# Patient Record
Sex: Female | Born: 2000 | Race: White | Hispanic: No | Marital: Single | Smoking: Never smoker
Health system: Southern US, Community
[De-identification: ages and names within clinical notes are randomized; demographics above are authoritative.]

## PROBLEM LIST (undated history)

## (undated) DIAGNOSIS — D649 Anemia, unspecified: Secondary | ICD-10-CM

## (undated) DIAGNOSIS — E739 Lactose intolerance, unspecified: Secondary | ICD-10-CM

## (undated) DIAGNOSIS — K219 Gastro-esophageal reflux disease without esophagitis: Secondary | ICD-10-CM

## (undated) DIAGNOSIS — G43909 Migraine, unspecified, not intractable, without status migrainosus: Secondary | ICD-10-CM

## (undated) DIAGNOSIS — F32A Depression, unspecified: Secondary | ICD-10-CM

## (undated) DIAGNOSIS — N946 Dysmenorrhea, unspecified: Secondary | ICD-10-CM

## (undated) DIAGNOSIS — F419 Anxiety disorder, unspecified: Secondary | ICD-10-CM

## (undated) DIAGNOSIS — K5900 Constipation, unspecified: Secondary | ICD-10-CM

## (undated) DIAGNOSIS — T7840XA Allergy, unspecified, initial encounter: Secondary | ICD-10-CM

## (undated) HISTORY — DX: Lactose intolerance, unspecified: E73.9

## (undated) HISTORY — DX: Anemia, unspecified: D64.9

## (undated) HISTORY — DX: Allergy, unspecified, initial encounter: T78.40XA

## (undated) HISTORY — DX: Dysmenorrhea, unspecified: N94.6

## (undated) HISTORY — DX: Constipation, unspecified: K59.00

## (undated) HISTORY — DX: Anxiety disorder, unspecified: F41.9

## (undated) HISTORY — DX: Depression, unspecified: F32.A

## (undated) HISTORY — DX: Migraine, unspecified, not intractable, without status migrainosus: G43.909

## (undated) HISTORY — PX: TYMPANOSTOMY TUBE PLACEMENT: SHX32

## (undated) HISTORY — DX: Gastro-esophageal reflux disease without esophagitis: K21.9

---
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
## 2019-03-05 IMAGING — MR MR HEAD W/O CM
7 of 9 series · 32 of 48 positions shown · non-contrast
Comparison: [HOSPITAL] noncontrast paranasal sinus CT
08/12/2005.

CLINICAL DATA: 16-year-old female with episodes over the past year
of sudden onset sharp right superior head pain/headache lasting up
to 30 sec each time. No known injury.

EXAM:
MRI HEAD WITHOUT CONTRAST
TECHNIQUE: Multiplanar, multiecho pulse sequences of the brain and surrounding
structures were obtained without intravenous contrast.

[Series 2: T1 · sagittal · 5.0mm · 0.45mm/px · 4 of 25 slices shown (1 of 2)]
[im 1/25]
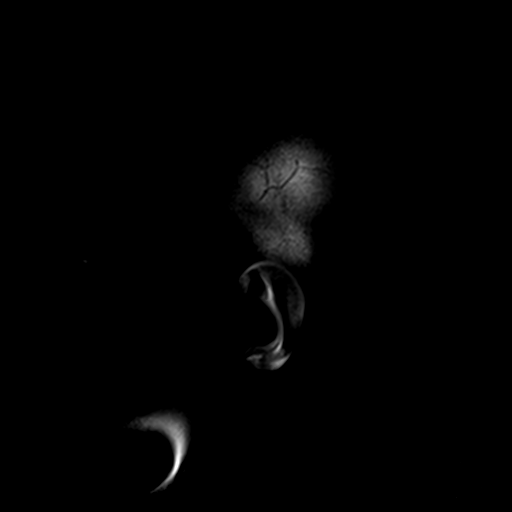
[im 9/25]
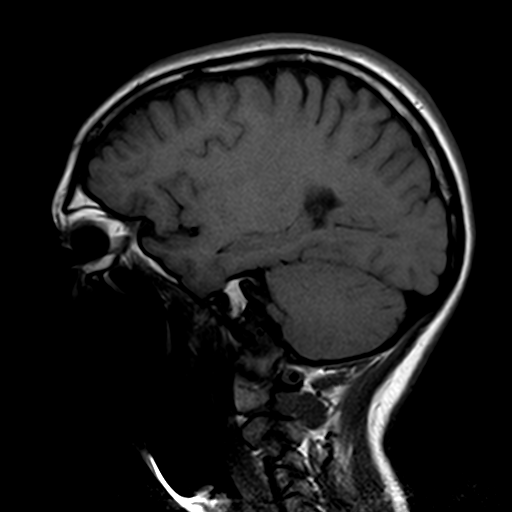
[im 17/25]
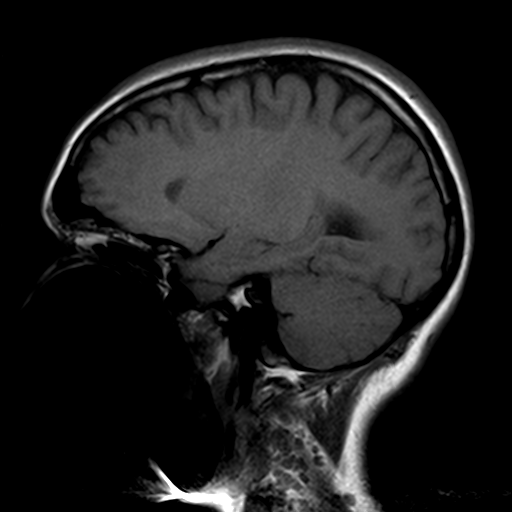
[im 25/25]
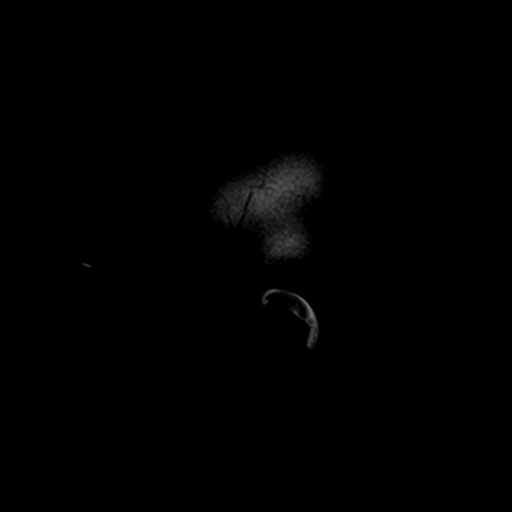

[Series 3: DWI · axial · 5.0mm · 1.80mm/px · z∈[-22,+119]mm · 7 of 43 slices shown (1 of 2)]
[im 1/43]
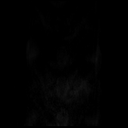
[im 8/43]
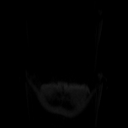
[im 15/43]
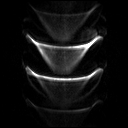
[im 22/43]
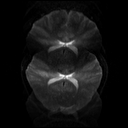
[im 29/43]
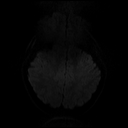
[im 36/43]
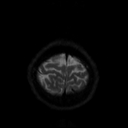
[im 43/43]
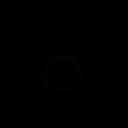

[Series 4: DWI · axial · 5.0mm · 1.80mm/px · z∈[-22,+119]mm · 4 of 24 slices shown (2 of 2)]
[im 1/24]
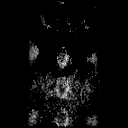
[im 8/24]
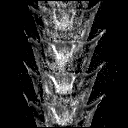
[im 16/24]
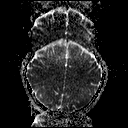
[im 24/24]
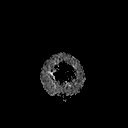

[Series 5: T2 · axial · 5.0mm · 0.51mm/px · z∈[-22,+119]mm · 4 of 24 slices shown (1 of 2)]
[im 1/24]
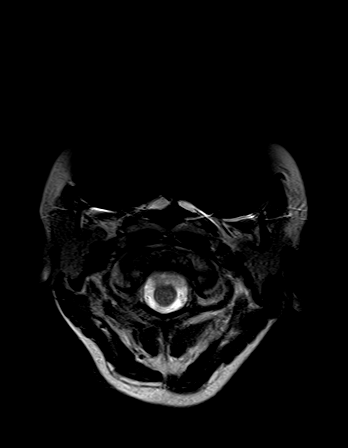
[im 8/24]
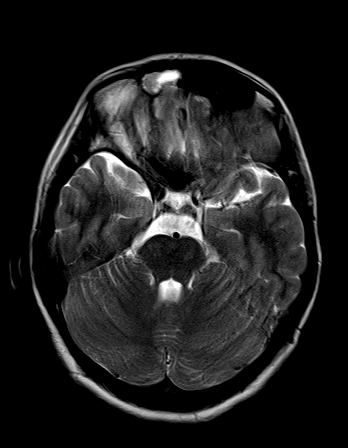
[im 16/24]
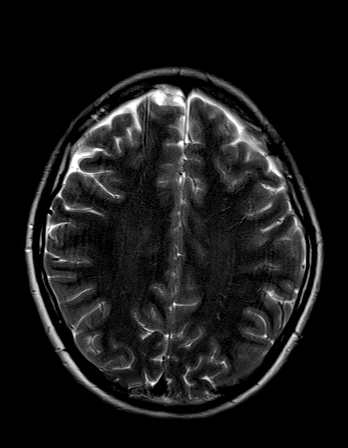
[im 24/24]
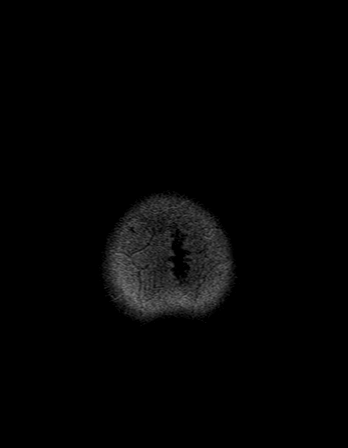

[Series 7: FLAIR · axial · 5.0mm · 0.45mm/px · z∈[-22,+119]mm · 4 of 24 slices shown]
[im 1/24]
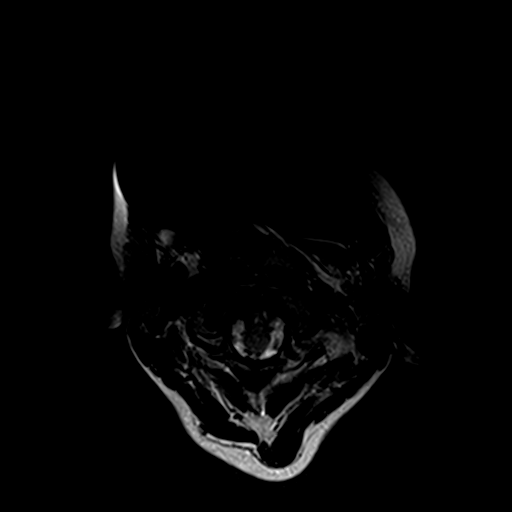
[im 8/24]
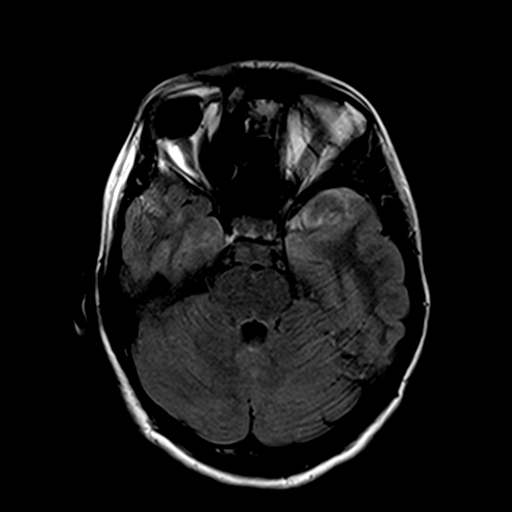
[im 16/24]
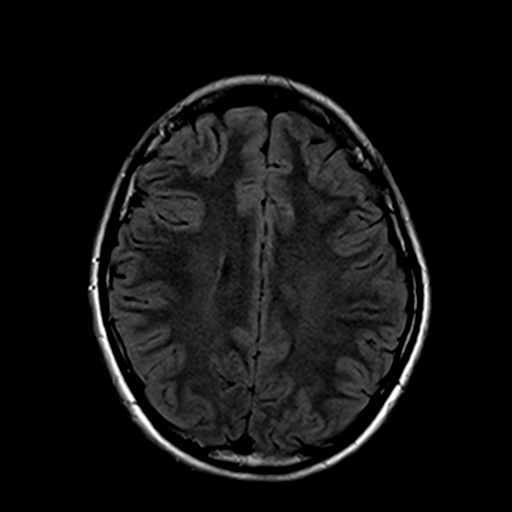
[im 24/24]
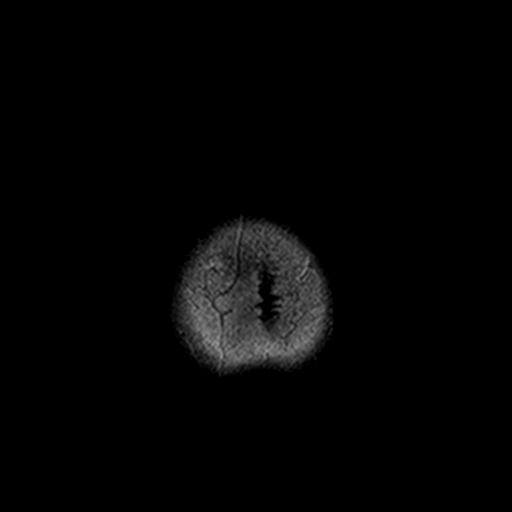

[Series 9: T2 · coronal · 5.0mm · 0.45mm/px · 5 of 30 slices shown (2 of 2)]
[im 1/30]
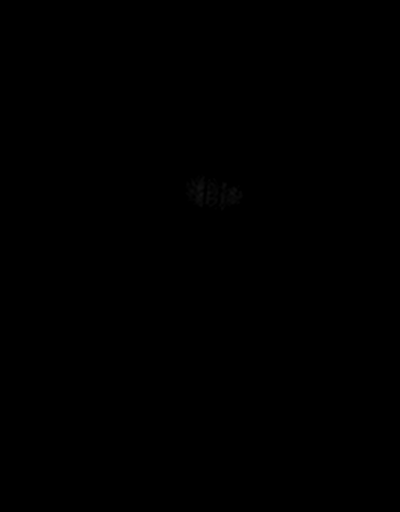
[im 8/30]
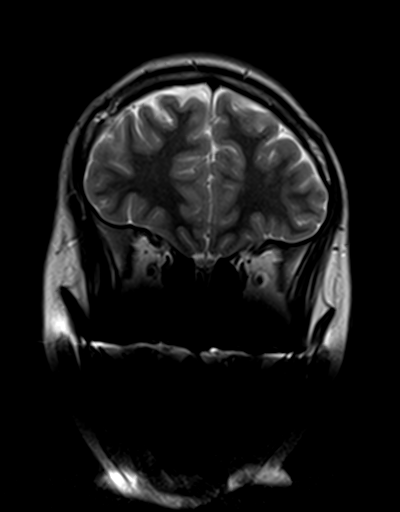
[im 15/30]
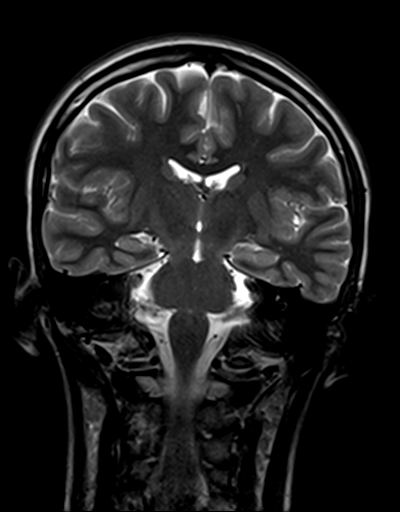
[im 22/30]
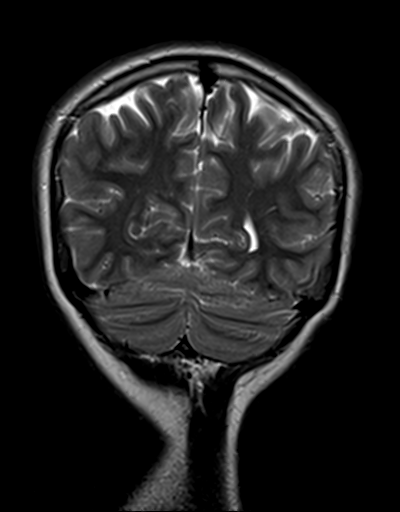
[im 30/30]
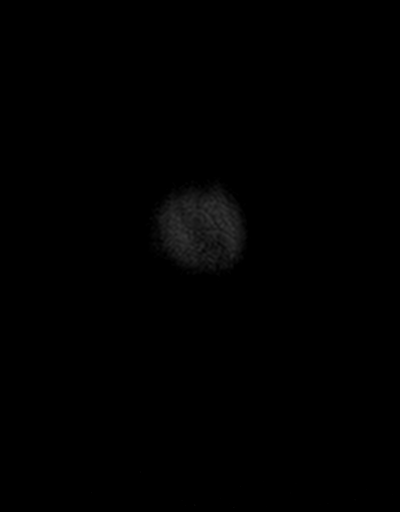

[Series 10: T1 · axial · 5.0mm · 0.45mm/px · z∈[-19,+116]mm · 4 of 23 slices shown (2 of 2)]
[im 1/23]
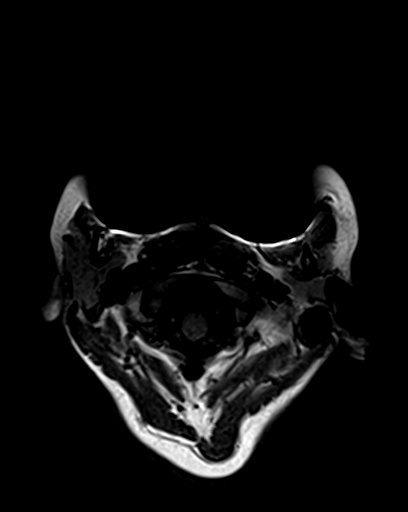
[im 8/23]
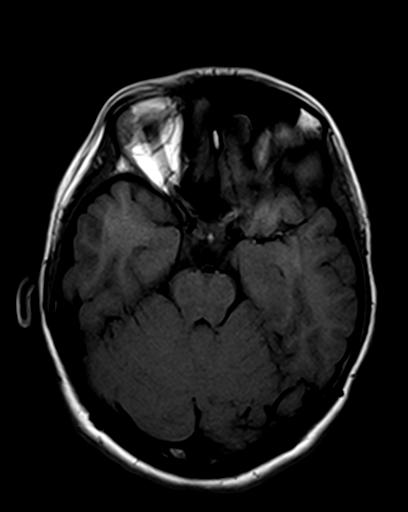
[im 15/23]
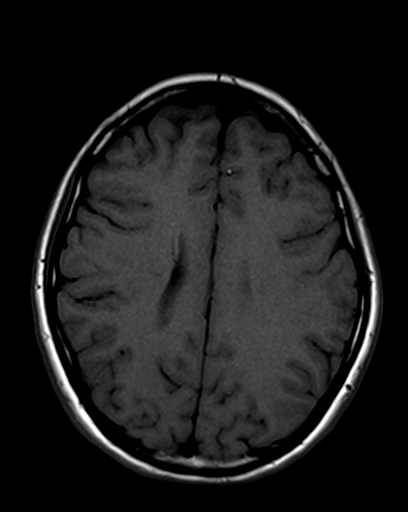
[im 23/23]
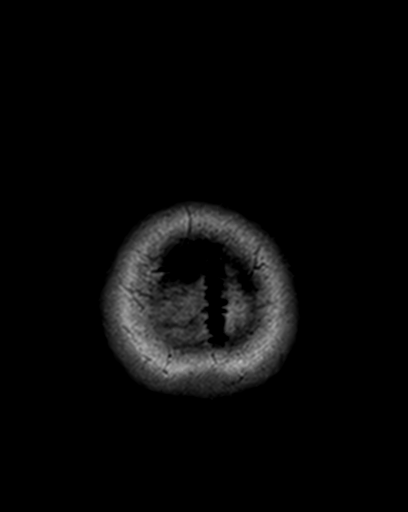

[32 of 48 positions shown; findings below may reference images not displayed]

FINDINGS: Metallic susceptibility distortion related to dental braces limits
some portions of the study despite alterations to the imaging
technique to minimize artifacts, especially diffusion weighted and
T2 weighted imaging.

Brain: Cerebral volume is within normal limits. Midline structures
are normally formed.

No restricted diffusion is evident. No midline shift, mass effect,
evidence of mass lesion, ventriculomegaly, extra-axial collection or
acute intracranial hemorrhage. Cervicomedullary junction and
pituitary are within normal limits.

Allowing for susceptibility artifact, gray and white matter signal
is within normal limits throughout the brain. No encephalomalacia
identified.

Vascular: Major intracranial vascular flow voids are preserved.

Skull and upper cervical spine: Negative visible cervical spine.
Visualized bone marrow signal is within normal limits.

Sinuses/Orbits: Pansinus paranasal sinus opacification demonstrated
in 8662. Visible sinus aeration has improved since that time,
although there remains at least moderate opacification of the right
frontal, left anterior ethmoids and right posterior ethmoid air
cells. The maxillary sinuses are poorly visible.

Grossly normal orbits soft tissues.

Other: Bilateral mastoid air cells remain clear. Visible internal
auditory structures appear normal. Visible scalp and neck soft
tissues appear normal.
IMPRESSION: 1. No brain abnormality identified when allowing for dental braces
related susceptibility artifact.
2. Moderate scattered paranasal sinus disease, although improved
compared to a 8662 sinus CT where pansinusitis was demonstrated.

## 2019-05-26 ENCOUNTER — Other Ambulatory Visit: Payer: Self-pay | Admitting: *Deleted

## 2019-05-26 MED ORDER — AMITRIPTYLINE HCL 25 MG PO TABS: 25.0000 mg | ORAL_TABLET | Freq: Every day | ORAL | 0 refills | Status: DC

## 2019-07-07 ENCOUNTER — Telehealth: Payer: Self-pay | Admitting: *Deleted

## 2019-07-07 NOTE — Telephone Encounter (Signed)
Pt calling c/o spotting to light flow since put new Nuvaring in on the 20 th. Pt said she went to college on the 17 th and was suppose to put new ring in on the 18 th but forgot it at home and her mom over nighted it to her. Pt said she put the new Nuvaring in on the 20 th and has been having spotting to light flow since. Pt said this has happened before. Told pt she probably just very sensitive and since she was late putting new ring in it is probably why she is spotting. Asked pt if she has any cramps? Pt said no. Told pt is should stop in a few days. Keep Nuvaring in and monitor if continues or develops increase in bleeding or cramps please let us know. Pt verbalized understanding.

## 2019-07-27 DIAGNOSIS — Z20822 Contact with and (suspected) exposure to covid-19: Secondary | ICD-10-CM | POA: Diagnosis not present

## 2019-08-16 ENCOUNTER — Other Ambulatory Visit: Payer: Self-pay | Admitting: *Deleted

## 2019-08-16 MED ORDER — ETONOGESTREL-ETHINYL ESTRADIOL 0.12-0.015 MG/24HR VA RING: VAGINAL_RING | VAGINAL | 2 refills | Status: DC

## 2019-08-20 ENCOUNTER — Other Ambulatory Visit: Payer: Self-pay | Admitting: Physician Assistant

## 2019-08-20 NOTE — Telephone Encounter (Signed)
MEDICATION: Nuvaring 0.12-0.015MG , amitriptyline 25 MG  PHARMACY: [facility], [place] [address]   Comments:   **Let patient know to contact pharmacy at the end of the day to make sure medication is ready. **  ** Please notify patient to allow 48-72 hours to process**  **Encourage patient to contact the pharmacy for refills or they can request refills through [facility]**

## 2019-08-30 DIAGNOSIS — Z03818 Encounter for observation for suspected exposure to other biological agents ruled out: Secondary | ICD-10-CM | POA: Diagnosis not present

## 2019-09-01 DIAGNOSIS — Z20828 Contact with and (suspected) exposure to other viral communicable diseases: Secondary | ICD-10-CM | POA: Diagnosis not present

## 2019-11-03 ENCOUNTER — Other Ambulatory Visit: Payer: Self-pay | Admitting: Physician Assistant

## 2019-12-20 ENCOUNTER — Other Ambulatory Visit: Payer: Self-pay | Admitting: Physician Assistant

## 2019-12-30 ENCOUNTER — Ambulatory Visit: Payer: BC Managed Care – PPO | Attending: Internal Medicine

## 2019-12-30 DIAGNOSIS — Z23 Encounter for immunization: Secondary | ICD-10-CM

## 2019-12-30 NOTE — Progress Notes (Signed)
   Covid-19 Vaccination Clinic  Name:  Ellyana Crigler    MRN: [id number] DOB: February 22, 2001  12/30/2019  Ms. Naser was observed post Covid-19 immunization for 15 minutes without incident. She was provided with Vaccine Information Sheet and instruction to access the V-Safe system.   Ms. Dimascio was instructed to call 911 with any severe reactions post vaccine: Marland Kitchen Difficulty breathing  . Swelling of face and throat  . A fast heartbeat  . A bad rash all over body  . Dizziness and weakness   Immunizations Administered    Name Date Dose VIS Date Route   Pfizer COVID-19 Vaccine 12/30/2019  8:20 AM 0.3 mL 08/04/2018 Intramuscular   Manufacturer: [employer]   Lot: [id number]   [place]: [id number]

## 2020-01-05 ENCOUNTER — Other Ambulatory Visit: Payer: Self-pay | Admitting: Physician Assistant

## 2020-01-05 NOTE — Telephone Encounter (Signed)
  LAST APPOINTMENT DATE: 12/20/2019   NEXT APPOINTMENT DATE:@Visit  date not found  MEDICATION: amitriptyline (ELAVIL) 25 MG tablet  PHARMACY: [id number] [place], [place] - [address]  COMMENTS: Patient states she is out and the pharmacy is needing approval from Sam.

## 2020-01-19 ENCOUNTER — Other Ambulatory Visit: Payer: Self-pay | Admitting: Physician Assistant

## 2020-04-10 ENCOUNTER — Other Ambulatory Visit: Payer: Self-pay | Admitting: Physician Assistant

## 2020-04-12 ENCOUNTER — Telehealth: Payer: Self-pay

## 2020-04-12 MED ORDER — AMITRIPTYLINE HCL 25 MG PO TABS: ORAL_TABLET | ORAL | 2 refills | Status: DC

## 2020-04-12 NOTE — Telephone Encounter (Signed)
..   LAST APPOINTMENT DATE: 04/10/2020   NEXT APPOINTMENT DATE:@Visit  date not found  MEDICATION:amitriptyline (ELAVIL) 25 MG tablet    [id number] - [place], [place]     Pt states that the pharmacy told her they lost her prescription and need a new one

## 2020-04-12 NOTE — Telephone Encounter (Signed)
Medication resent

## 2020-05-23 ENCOUNTER — Other Ambulatory Visit: Payer: Self-pay | Admitting: Physician Assistant

## 2020-05-31 ENCOUNTER — Other Ambulatory Visit: Payer: Self-pay | Admitting: Physician Assistant

## 2020-06-17 DIAGNOSIS — M7581 Other shoulder lesions, right shoulder: Secondary | ICD-10-CM | POA: Diagnosis not present

## 2020-06-17 DIAGNOSIS — M25511 Pain in right shoulder: Secondary | ICD-10-CM | POA: Diagnosis not present

## 2020-06-27 DIAGNOSIS — Z20822 Contact with and (suspected) exposure to covid-19: Secondary | ICD-10-CM | POA: Diagnosis not present

## 2020-06-27 DIAGNOSIS — J029 Acute pharyngitis, unspecified: Secondary | ICD-10-CM | POA: Diagnosis not present

## 2020-06-27 DIAGNOSIS — U071 COVID-19: Secondary | ICD-10-CM | POA: Diagnosis not present

## 2020-08-01 ENCOUNTER — Other Ambulatory Visit: Payer: Self-pay | Admitting: Physician Assistant

## 2020-08-28 ENCOUNTER — Other Ambulatory Visit: Payer: Self-pay | Admitting: Physician Assistant

## 2020-09-28 ENCOUNTER — Telehealth: Payer: Self-pay

## 2020-09-28 NOTE — Telephone Encounter (Signed)
MEDICATION: etonogestrel-ethinyl estradiol (NUVARING) 0.12-0.015 MG/24HR vaginal ring   amitriptyline (ELAVIL) 25 MG tablet  PHARMACY:  [id number] - [place], [place] Phone:  [phone]  Fax:  [phone]       Comments:   **Let patient know to contact pharmacy at the end of the day to make sure medication is ready. **  ** Please notify patient to allow 48-72 hours to process**  **Encourage patient to contact the pharmacy for refills or they can request refills through [facility]**

## 2020-09-28 NOTE — Telephone Encounter (Signed)
LVM for patient call back and schedule appt.

## 2020-09-28 NOTE — Telephone Encounter (Signed)
Please call pt and schedule an appointment we have not seen patient in 2 years. I can not fill medications until pt is seen. Sorry

## 2020-10-02 ENCOUNTER — Telehealth: Payer: BC Managed Care – PPO | Admitting: Physician Assistant

## 2020-10-03 ENCOUNTER — Encounter: Payer: Self-pay | Admitting: Physician Assistant

## 2020-10-03 ENCOUNTER — Telehealth (INDEPENDENT_AMBULATORY_CARE_PROVIDER_SITE_OTHER): Payer: BC Managed Care – PPO | Admitting: Physician Assistant

## 2020-10-03 VITALS — Ht 67.0 in | Wt 157.0 lb

## 2020-10-03 DIAGNOSIS — R6889 Other general symptoms and signs: Secondary | ICD-10-CM | POA: Diagnosis not present

## 2020-10-03 DIAGNOSIS — Z3009 Encounter for other general counseling and advice on contraception: Secondary | ICD-10-CM | POA: Diagnosis not present

## 2020-10-03 DIAGNOSIS — G43009 Migraine without aura, not intractable, without status migrainosus: Secondary | ICD-10-CM

## 2020-10-03 MED ORDER — AMITRIPTYLINE HCL 25 MG PO TABS: 25.0000 mg | ORAL_TABLET | Freq: Every day | ORAL | 3 refills | Status: DC

## 2020-10-03 MED ORDER — ETONOGESTREL-ETHINYL ESTRADIOL 0.12-0.015 MG/24HR VA RING: VAGINAL_RING | VAGINAL | 3 refills | Status: DC

## 2020-10-03 NOTE — Progress Notes (Signed)
Virtual Visit via Video   I connected with Courtney Shelton on 10/03/20 at  7:30 AM EDT by a video enabled telemedicine application and verified that I am speaking with the correct person using two identifiers. Location patient: Home Location provider: [facility], Office Persons participating in the virtual visit: Laylia Arlis, [place] PA-C, Anselmo Pickler, LPN   I discussed the limitations of evaluation and management by telemedicine and the availability of in person appointments. The patient expressed understanding and agreed to proceed.  I acted as a Education administrator for [employer], PA-C [employer], LPN   Subjective:   HPI:   Courtney Shelton is in her 3rd year at [employer].  Migraine Pt has been having headaches off and on. Currently taking Amitriptyline 25 mg at bedtime. Needs refill. This medication is working well for her. Denies auras, changes in migraines, n/v, changes in vision.  Birth control Courtney Shelton is currently using Nuva-Ring. Feels this is working well for her and making her periods more manageable in regards to flow and pain. Courtney Shelton would like to continue use of this. Denies concerns with blood clots, leg pain, chest pain, SOB, moodiness.  Temperature Intolerance Feels like over the past few weeks Courtney Shelton has been more hot than usual. Does have history of ?goiter and ?thyroiditis per her chart. Most recent thyroid labs WNL. Courtney Shelton denies unintentional weight changes or changes to skin or hair.  ROS: See pertinent positives and negatives per HPI.  Patient Active Problem List   Diagnosis Date Noted  . 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 Seizure-like activity ([place]) 04/30/2017  . Thyroiditis, autoimmune 01/18/2013  . 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 Goiter 09/07/2012  . Skin striae 09/07/2012  . Dyspepsia 09/07/2012  . GERD (gastroesophageal reflux disease) 09/07/2012  . Dysmenorrhea in the adolescent   . Lactose intolerance   . Constipation     Social History   Tobacco  Use  . Smoking status: Never Smoker  . Smokeless tobacco: Never Used  Substance Use Topics  . Alcohol use: No    Alcohol/week: 0.0 standard drinks    Current Outpatient Medications:  .  B Complex Vitamins (B COMPLEX PO), Take 1 capsule by mouth daily in the afternoon., Disp: , Rfl:  .  esomeprazole (NEXIUM) 40 MG packet, Take 40 mg by mouth daily before breakfast., Disp: 30 each, Rfl: 2 .  fexofenadine (ALLEGRA) 180 MG tablet, Take 180 mg by mouth daily., Disp: , Rfl:  .  fluticasone (FLONASE) 50 MCG/ACT nasal spray, Place 2 sprays into both nostrils daily., Disp: 16 g, Rfl: 6 .  vitamin C (ASCORBIC ACID) 500 MG tablet, Take 500 mg by mouth daily., Disp: , Rfl:  .  Zinc Sulfate (ZINC-220 PO), Take 1 tablet by mouth daily in the afternoon., Disp: , Rfl:  .  amitriptyline (ELAVIL) 25 MG tablet, Take 1 tablet (25 mg total) by mouth at bedtime., Disp: 90 tablet, Rfl: 3 .  etonogestrel-ethinyl estradiol (NUVARING) 0.12-0.015 MG/24HR vaginal ring, INSERT 1 RING VAGINALLY AS DIRECTED. REMOVE AFTER 3 WEEKS. WAIT 7 DAYS BEFORE INSERTING NEW RING, Disp: 3 each, Rfl: 3  Allergies  Allergen Reactions  . Zithromax [Azithromycin Dihydrate]   . Zyrtec [Cetirizine Hcl]     Pt gets a really bad headache    Objective:   VITALS: Per patient if applicable, see vitals. GENERAL: Alert, appears well and in no acute distress. HEENT: Atraumatic, conjunctiva clear, no obvious abnormalities on inspection of external nose and ears. NECK: Normal movements of the head and neck.  CARDIOPULMONARY: No increased WOB. Speaking in clear sentences. I:E ratio WNL.  MS: Moves all visible extremities without noticeable abnormality. PSYCH: Pleasant and cooperative, well-groomed. Speech normal rate and rhythm. Affect is appropriate. Insight and judgement are appropriate. Attention is focused, linear, and appropriate.  NEURO: CN grossly intact. Oriented as arrived to appointment on time with no prompting. Moves both UE  equally.  SKIN: No obvious lesions, wounds, erythema, or cyanosis noted on face or hands.  Assessment and Plan:   [name] was seen today for migraine and medication refill.  Diagnoses and all orders for this visit:  Atypical migraine Well controlled with elavil 25 mg daily. No new concerning HA symptoms. Follow-up in 1 yr, sooner if concerns.  Birth control counseling Doing well with nuvaring. Refill x 1 yr, sooner if concerns.  Temperature intolerance Difficult to assess virtually. Recommend logging symptoms to see if there are triggers and follow-up in office to review and update blood work at [facility].  Other orders -     etonogestrel-ethinyl estradiol (NUVARING) 0.12-0.015 MG/24HR vaginal ring; INSERT 1 RING VAGINALLY AS DIRECTED. REMOVE AFTER 3 WEEKS. WAIT 7 DAYS BEFORE INSERTING NEW RING -     amitriptyline (ELAVIL) 25 MG tablet; Take 1 tablet (25 mg total) by mouth at bedtime.    I discussed the assessment and treatment plan with the patient. The patient was provided an opportunity to ask questions and all were answered. The patient agreed with the plan and demonstrated an understanding of the instructions.   The patient was advised to call back or seek an in-person evaluation if the symptoms worsen or if the condition fails to improve as anticipated.   CMA or LPN served as scribe during this visit. History, Physical, and Plan performed by medical provider. The above documentation has been reviewed and is accurate and complete.  [place], [place] 10/03/2020

## 2021-03-19 ENCOUNTER — Other Ambulatory Visit: Payer: Self-pay

## 2021-03-19 ENCOUNTER — Encounter: Payer: Self-pay | Admitting: Physician Assistant

## 2021-03-19 ENCOUNTER — Ambulatory Visit (INDEPENDENT_AMBULATORY_CARE_PROVIDER_SITE_OTHER): Payer: BC Managed Care – PPO | Admitting: Physician Assistant

## 2021-03-19 VITALS — BP 130/90 | HR 115 | Temp 98.5°F | Ht 67.0 in | Wt 171.4 lb

## 2021-03-19 DIAGNOSIS — G43009 Migraine without aura, not intractable, without status migrainosus: Secondary | ICD-10-CM

## 2021-03-19 DIAGNOSIS — R1084 Generalized abdominal pain: Secondary | ICD-10-CM

## 2021-03-19 DIAGNOSIS — R194 Change in bowel habit: Secondary | ICD-10-CM

## 2021-03-19 DIAGNOSIS — R55 Syncope and collapse: Secondary | ICD-10-CM | POA: Diagnosis not present

## 2021-03-19 DIAGNOSIS — R42 Dizziness and giddiness: Secondary | ICD-10-CM | POA: Diagnosis not present

## 2021-03-19 DIAGNOSIS — Z23 Encounter for immunization: Secondary | ICD-10-CM

## 2021-03-19 LAB — COMPREHENSIVE METABOLIC PANEL
ALT: 20 U/L (ref 0–35)
AST: 19 U/L (ref 0–37)
Albumin: 4.3 g/dL (ref 3.5–5.2)
Alkaline Phosphatase: 50 U/L (ref 39–117)
BUN: 7 mg/dL (ref 6–23)
CO2: 27 mEq/L (ref 19–32)
Calcium: 9.8 mg/dL (ref 8.4–10.5)
Chloride: 103 mEq/L (ref 96–112)
Creatinine, Ser: 0.71 mg/dL (ref 0.40–1.20)
GFR: 121.79 mL/min (ref >60.00)
Glucose, Bld: 95 mg/dL (ref 70–99)
Potassium: 4.2 mEq/L (ref 3.5–5.1)
Sodium: 138 mEq/L (ref 135–145)
Total Bilirubin: 0.2 mg/dL (ref 0.2–1.2)
Total Protein: 7.9 g/dL (ref 6.0–8.3)

## 2021-03-19 LAB — URINALYSIS, ROUTINE W REFLEX MICROSCOPIC
Bilirubin Urine: NEGATIVE
Ketones, ur: NEGATIVE
Leukocytes,Ua: NEGATIVE
Nitrite: NEGATIVE
Specific Gravity, Urine: 1.015 (ref 1.000–1.030)
Total Protein, Urine: NEGATIVE
Urine Glucose: NEGATIVE
Urobilinogen, UA: 0.2 (ref 0.0–1.0)
pH: 6.5 (ref 5.0–8.0)

## 2021-03-19 LAB — H. PYLORI ANTIBODY, IGG: H Pylori IgG: NEGATIVE

## 2021-03-19 LAB — CBC WITH DIFFERENTIAL/PLATELET
Basophils Absolute: 0 10*3/uL (ref 0.0–0.1)
Basophils Relative: 0.6 % (ref 0.0–3.0)
Eosinophils Absolute: 0.3 10*3/uL (ref 0.0–0.7)
Eosinophils Relative: 4.8 % (ref 0.0–5.0)
HCT: 39.9 % (ref 36.0–46.0)
Hemoglobin: 13.5 g/dL (ref 12.0–15.0)
Lymphocytes Relative: 28.2 % (ref 12.0–46.0)
Lymphs Abs: 1.7 10*3/uL (ref 0.7–4.0)
MCHC: 33.8 g/dL (ref 30.0–36.0)
MCV: 83.3 fl (ref 78.0–100.0)
Monocytes Absolute: 0.3 10*3/uL (ref 0.1–1.0)
Monocytes Relative: 5.9 % (ref 3.0–12.0)
Neutro Abs: 3.5 10*3/uL (ref 1.4–7.7)
Neutrophils Relative %: 60.5 % (ref 43.0–77.0)
Platelets: 258 10*3/uL (ref 150.0–400.0)
RBC: 4.79 Mil/uL (ref 3.87–5.11)
RDW: 14.2 % (ref 11.5–15.5)
WBC: 5.9 10*3/uL (ref 4.0–10.5)

## 2021-03-19 LAB — TSH: TSH: 2.27 u[IU]/mL (ref 0.35–5.50)

## 2021-03-19 MED ORDER — AMITRIPTYLINE HCL 50 MG PO TABS: 50.0000 mg | ORAL_TABLET | Freq: Every day | ORAL | 1 refills | Status: DC

## 2021-03-19 NOTE — Progress Notes (Signed)
Courtney Shelton is a 20 y.o. female here for a new problem.  I acted as a Education administrator for [employer], PA-C [employer], LPN   History of Present Illness:   Chief Complaint  Patient presents with   Dizziness   Gi issues   Loss of Consciousness     HPI  GI issues Pt c/o mid abdominal pain when she eats, diarrhea past few months. Sometimes pain is sharp and other times just constant pain. If she does not eat throughout the day then she is nauseous. Thinks that she has gluten and dairy intolerance. Having more diarrhea than constipation, has history of constipation at baseline. Denies rectal bleeding, vomiting, unintentional weight loss. Denies family hx colon CA.   Lightheadedness Pt is c/o lightheadedenss, nausea and tingling in her face, clammy episodes 3 times a week in the past month. Pt says when she feels symptoms coming on she will sit down so she does not pass out. Lasting 5-10 minutes. Not related to food intake, stress, sleep changes.   Syncope Pt has had at least 3 true syncope episodes over the past few years. Lasts a few seconds. Has been witnessed. She saw me in Sep 2019 for a syncopal episode. She ended up seeing neurology for bad headaches at that time. Denies chest pain, SOB, LE swelling, calf pain at this time.  Migraine Over the past two weeks, has had increase in migraine frequency. She has been stable with amitryptyline 25 mg for years. Has never been on an abortive medication. Does not get auras. Denies weakness on one side of the body, slurred speech, confusion, increase in severity of migraines, vision changes.    Past Medical History:  Diagnosis Date   Anemia    Constipation    Dysmenorrhea in the adolescent    Lactose intolerance      Social History   Tobacco Use   Smoking status: Never   Smokeless tobacco: Never  Vaping Use   Vaping Use: Never used  Substance Use Topics   Alcohol use: No    Alcohol/week: 0.0 standard drinks   Drug use: No     Past Surgical History:  Procedure Laterality Date   TYMPANOSTOMY TUBE PLACEMENT Bilateral     Family History  Problem Relation Age of Onset   Thyroid disease Mother        acquired hypothyroidism, without surgery or irradiation   Anemia Mother    Hypertension Maternal Grandmother    Thyroid disease Maternal Grandmother        Acquired hypothyroidism late in life, without having had thyroid surgery or irradiation.    Other Maternal Grandmother        precancerous uterus   Hypertension Maternal Grandfather    Bladder Cancer Maternal Grandfather    Breast cancer Paternal Grandmother     Allergies  Allergen Reactions   Zithromax [Azithromycin Dihydrate]    Zyrtec [Cetirizine Hcl]     Pt gets a really bad headache    Current Medications:   Current Outpatient Medications:    amitriptyline (ELAVIL) 50 MG tablet, Take 1 tablet (50 mg total) by mouth at bedtime., Disp: 90 tablet, Rfl: 1   B Complex Vitamins (B COMPLEX PO), Take 1 capsule by mouth daily in the afternoon., Disp: , Rfl:    etonogestrel-ethinyl estradiol (NUVARING) 0.12-0.015 MG/24HR vaginal ring, INSERT 1 RING VAGINALLY AS DIRECTED. REMOVE AFTER 3 WEEKS. WAIT 7 DAYS BEFORE INSERTING NEW RING, Disp: 3 each, Rfl: 3   fluticasone (FLONASE) 50 MCG/ACT  nasal spray, Place 2 sprays into both nostrils daily., Disp: 16 g, Rfl: 6   vitamin C (ASCORBIC ACID) 500 MG tablet, Take 500 mg by mouth daily., Disp: , Rfl:    Zinc Sulfate (ZINC-220 PO), Take 1 tablet by mouth daily in the afternoon., Disp: , Rfl:    Review of Systems:   ROS Negative unless otherwise specified per HPI.  Vitals:   Vitals:   03/19/21 1011  BP: 130/90  Pulse: (!) 115  Temp: 98.5 F (36.9 C)  TempSrc: Temporal  SpO2: 96%  Weight: 171 lb 6.1 oz (77.7 kg)  Height: 5\' 7"  (1.702 m)     Body mass index is 26.84 kg/m.  Physical Exam:   Physical Exam Vitals and nursing note reviewed.  Constitutional:      General: She is not in acute  distress.    Appearance: She is well-developed. She is not ill-appearing or toxic-appearing.  Cardiovascular:     Rate and Rhythm: Normal rate and regular rhythm.     Pulses: Normal pulses.     Heart sounds: Normal heart sounds, S1 normal and S2 normal.  Pulmonary:     Effort: Pulmonary effort is normal.     Breath sounds: Normal breath sounds.  Abdominal:     General: Abdomen is flat. Bowel sounds are normal.     Palpations: Abdomen is soft.     Tenderness: There is no abdominal tenderness.  Skin:    General: Skin is warm and dry.  Neurological:     Mental Status: She is alert.     GCS: GCS eye subscore is 4. GCS verbal subscore is 5. GCS motor subscore is 6.     Cranial Nerves: Cranial nerves are intact.     Sensory: Sensation is intact.     Motor: Motor function is intact.     Coordination: Coordination is intact.     Gait: Gait is intact.  Psychiatric:        Speech: Speech normal.        Behavior: Behavior normal. Behavior is cooperative.    Assessment and Plan:   Syncope, unspecified syncope type; Episodic lightheadedness EKG tracing is personally reviewed.  EKG notes NSR.  No acute changes.  Will update blood work today ZIO patch Cardiology referral Worsening precautions advised  Bowel habit changes; Generalized abdominal pain Update blood work today and include H Pylori Start daily antacid medication of choice such as nexium or prilosec GI referral Avoid triggering foods  Atypical migraine Increase elavil to 50 mg daily After shared decision making, will not use abortive medication because the actual migraines are not debilitating for patient and sometimes lasts just a few seconds Consider referral to neurology if lack of improvement or any worsening/new sx Follow-up with me in 3 months, sooner if concerns  Need for prophylactic vaccination with combined diphtheria-tetanus-pertussis (DTP) vaccine Completed today  CMA or LPN served as scribe during this  visit. History, Physical, and Plan performed by medical provider. The above documentation has been reviewed and is accurate and complete.  Time spent with patient today was 45 minutes which consisted of chart review, discussing diagnosis, work up, treatment answering questions and documentation.   [name], PA-C

## 2021-03-19 NOTE — Patient Instructions (Signed)
It was great to see you!  Start daily OTC antacid medication of your choice such as prilosec or nexium (generic is fine)  Increase amitriptyline to 50 mg daily. Follow-up with me in 3 months for this (sooner if concerns) to see how it is helping your migraines and anxiety.  You will be contacted about your zio patch (heart monitor)  Referrals today for gastroenterology and cardiology  We updated your flu shot and tetanus shot today  If a referral was placed today, you will be contacted for an appointment. Please note that routine referrals can sometimes take up to 3-4 weeks to process. Please call our office if you haven't heard anything after this time frame.  Take care,  [name] PA-C

## 2021-03-21 ENCOUNTER — Encounter: Payer: Self-pay | Admitting: *Deleted

## 2021-03-21 ENCOUNTER — Ambulatory Visit (INDEPENDENT_AMBULATORY_CARE_PROVIDER_SITE_OTHER): Payer: BC Managed Care – PPO

## 2021-03-21 DIAGNOSIS — R55 Syncope and collapse: Secondary | ICD-10-CM

## 2021-03-21 DIAGNOSIS — R42 Dizziness and giddiness: Secondary | ICD-10-CM

## 2021-03-21 NOTE — Progress Notes (Unsigned)
Patient enrolled for [employer] to mail a 7 day ZIO XT monitor to her address on file. Letter with instructions mailed to patient.

## 2021-03-28 ENCOUNTER — Encounter: Payer: Self-pay | Admitting: Gastroenterology

## 2021-03-31 DIAGNOSIS — R55 Syncope and collapse: Secondary | ICD-10-CM

## 2021-03-31 DIAGNOSIS — R42 Dizziness and giddiness: Secondary | ICD-10-CM | POA: Diagnosis not present

## 2021-04-17 ENCOUNTER — Other Ambulatory Visit: Payer: Self-pay | Admitting: Physician Assistant

## 2021-04-17 DIAGNOSIS — R42 Dizziness and giddiness: Secondary | ICD-10-CM

## 2021-04-17 DIAGNOSIS — R55 Syncope and collapse: Secondary | ICD-10-CM

## 2021-05-02 ENCOUNTER — Encounter: Payer: Self-pay | Admitting: Cardiology

## 2021-05-02 ENCOUNTER — Ambulatory Visit (INDEPENDENT_AMBULATORY_CARE_PROVIDER_SITE_OTHER): Payer: BC Managed Care – PPO | Admitting: Cardiology

## 2021-05-02 ENCOUNTER — Encounter: Payer: Self-pay | Admitting: Gastroenterology

## 2021-05-02 ENCOUNTER — Ambulatory Visit (INDEPENDENT_AMBULATORY_CARE_PROVIDER_SITE_OTHER): Payer: BC Managed Care – PPO | Admitting: Gastroenterology

## 2021-05-02 ENCOUNTER — Other Ambulatory Visit (INDEPENDENT_AMBULATORY_CARE_PROVIDER_SITE_OTHER): Payer: BC Managed Care – PPO

## 2021-05-02 ENCOUNTER — Other Ambulatory Visit: Payer: Self-pay

## 2021-05-02 VITALS — BP 124/80 | HR 88 | Ht 68.0 in | Wt 173.0 lb

## 2021-05-02 VITALS — BP 118/76 | HR 80 | Ht 68.0 in | Wt 174.4 lb

## 2021-05-02 DIAGNOSIS — R194 Change in bowel habit: Secondary | ICD-10-CM

## 2021-05-02 DIAGNOSIS — R55 Syncope and collapse: Secondary | ICD-10-CM | POA: Diagnosis not present

## 2021-05-02 DIAGNOSIS — R110 Nausea: Secondary | ICD-10-CM

## 2021-05-02 DIAGNOSIS — R1084 Generalized abdominal pain: Secondary | ICD-10-CM

## 2021-05-02 DIAGNOSIS — R12 Heartburn: Secondary | ICD-10-CM

## 2021-05-02 DIAGNOSIS — R140 Abdominal distension (gaseous): Secondary | ICD-10-CM

## 2021-05-02 DIAGNOSIS — K580 Irritable bowel syndrome with diarrhea: Secondary | ICD-10-CM

## 2021-05-02 LAB — CORTISOL: Cortisol, Plasma: 8.7 ug/dL

## 2021-05-02 LAB — LIPASE: Lipase: 28 U/L (ref 11.0–59.0)

## 2021-05-02 LAB — HEPATIC FUNCTION PANEL
ALT: 13 U/L (ref 0–35)
AST: 17 U/L (ref 0–37)
Albumin: 4.2 g/dL (ref 3.5–5.2)
Alkaline Phosphatase: 50 U/L (ref 39–117)
Bilirubin, Direct: 0 mg/dL (ref 0.0–0.3)
Total Bilirubin: 0.3 mg/dL (ref 0.2–1.2)
Total Protein: 7.8 g/dL (ref 6.0–8.3)

## 2021-05-02 LAB — C-REACTIVE PROTEIN: CRP: <1 mg/dL (ref 0.5–20.0)

## 2021-05-02 LAB — SEDIMENTATION RATE: Sed Rate: 24 mm/hr — ABNORMAL HIGH (ref 0–20)

## 2021-05-02 MED ORDER — ONDANSETRON 4 MG PO TBDP: 4.0000 mg | ORAL_TABLET | Freq: Three times a day (TID) | ORAL | 0 refills | Status: DC | PRN

## 2021-05-02 MED ORDER — DICYCLOMINE HCL 10 MG PO CAPS: 10.0000 mg | ORAL_CAPSULE | Freq: Three times a day (TID) | ORAL | 0 refills | Status: DC

## 2021-05-02 MED ORDER — OMEPRAZOLE 40 MG PO CPDR: 40.0000 mg | DELAYED_RELEASE_CAPSULE | Freq: Every day | ORAL | 3 refills | Status: DC

## 2021-05-02 NOTE — Progress Notes (Signed)
Electrophysiology Office Note   Date:  05/02/2021   ID:  Courtney Shelton, DOB March 04, 2001, MRN [id number]  PCP:  [name], PA  Cardiologist:   Primary Electrophysiologist:  Kalani Sthilaire Meredith Leeds, MD    Chief Complaint: Syncope   History of Present Illness: Courtney Shelton is a 20 y.o. female who is being seen today for the evaluation of syncope at the request of [name], [place]. Presenting today for electrophysiology evaluation.  She presents today for monitoring of syncope.  Currently she feels well.  She has syncopal episodes every few weeks.  Her syncopal episodes occur when she is standing or place for up to 10 minutes.  They are associated with prior lightheadedness.  Sometimes she can sit down to avoid her episode of syncope.  There also times that she can be standing and not have these episodes.  Her mother initially thought that it was due to anxiety, but she does not have increased anxiety when they occur.  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  She can exercise without issue.  They do not occur when she stands up too quickly.  She has been having palpitations over the past few weeks, but felt well when wearing the monitor.  Normal  Today, she denies symptoms of palpitations, chest pain, shortness of breath, orthopnea, PND, lower extremity edema, claudication, dizziness, presyncope, syncope, bleeding, or neurologic sequela. The patient is tolerating medications without difficulties.    Past Medical History:  Diagnosis Date   Anemia    Constipation    Dysmenorrhea in the adolescent    Lactose intolerance    Past Surgical History:  Procedure Laterality Date   TYMPANOSTOMY TUBE PLACEMENT Bilateral      Current Outpatient Medications  Medication Sig Dispense Refill   amitriptyline (ELAVIL) 50 MG tablet Take 1 tablet (50 mg total) by mouth at bedtime. 90 tablet 1   B Complex Vitamins (B COMPLEX PO) Take 1  capsule by mouth daily in the afternoon.     dicyclomine (BENTYL) 10 MG capsule Take 1 capsule (10 mg total) by mouth 4 (four) times daily -  before meals and at bedtime. 90 capsule 0   etonogestrel-ethinyl estradiol (NUVARING) 0.12-0.015 MG/24HR vaginal ring INSERT 1 RING VAGINALLY AS DIRECTED. REMOVE AFTER 3 WEEKS. WAIT 7 DAYS BEFORE INSERTING NEW RING 3 each 3   fluticasone (FLONASE) 50 MCG/ACT nasal spray Place 2 sprays into both nostrils daily. 16 g 6   omeprazole (PRILOSEC) 40 MG capsule Take 1 capsule (40 mg total) by mouth daily. 90 capsule 3   ondansetron (ZOFRAN-ODT) 4 MG disintegrating tablet Take 1 tablet (4 mg total) by mouth every 8 (eight) hours as needed for nausea or vomiting. 20 tablet 0   vitamin C (ASCORBIC ACID) 500 MG tablet Take 500 mg by mouth daily.     Zinc Sulfate (ZINC-220 PO) Take 1 tablet by mouth daily in the afternoon.     No current facility-administered medications for this visit.    Allergies:   Zithromax [azithromycin dihydrate] and Zyrtec [cetirizine hcl]   Social History:  The patient  reports that she has never smoked. She has never used smokeless tobacco. She reports that she does not drink alcohol and does not use drugs.   Family History:  The patient's family history includes Anemia in her mother; Bladder Cancer in her maternal grandfather; Breast cancer in her paternal grandmother; Hypertension in her maternal grandfather and maternal grandmother;  Other in her maternal grandmother; Thyroid disease in her maternal grandmother and mother.    ROS:  Please see the history of present illness.   Otherwise, review of systems is positive for none.   All other systems are reviewed and negative.    PHYSICAL EXAM: VS:  BP 118/76   Pulse 80   Ht 5\' 8"  (1.727 m)   Wt 174 lb 6.4 oz (79.1 kg)   LMP 04/01/2021   SpO2 97%   BMI 26.52 kg/m  , BMI Body mass index is 26.52 kg/m. GEN: Well nourished, well developed, in no acute distress  HEENT: normal  Neck: no  JVD, carotid bruits, or masses Cardiac: RRR; no murmurs, rubs, or gallops,no edema  Respiratory:  clear to auscultation bilaterally, normal work of breathing GI: soft, nontender, nondistended, + BS MS: no deformity or atrophy  Skin: warm and dry Neuro:  Strength and sensation are intact Psych: euthymic mood, full affect  EKG:  EKG is not ordered today. Personal review of the ekg ordered 03/19/21 shows sinus rhythm  Recent Labs: 03/19/2021: ALT 20; BUN 7; Creatinine, Ser 0.71; Hemoglobin 13.5; Platelets 258.0; Potassium 4.2; Sodium 138; TSH 2.27    Lipid Panel  No results found for: CHOL, TRIG, HDL, CHOLHDL, VLDL, LDLCALC, LDLDIRECT   Wt Readings from Last 3 Encounters:  05/02/21 174 lb 6.4 oz (79.1 kg)  05/02/21 173 lb (78.5 kg)  03/19/21 171 lb 6.1 oz (77.7 kg)      Other studies Reviewed: Additional studies/ records that were reviewed today include: Cardiac monitor 04/17/2021 personally reviewed Review of the above records today demonstrates:   NSR average HR 80 bpm.  Isolated episode of NSVT only 9 beats PAC/PVC < 1% total beats no significant arrhythmias   ASSESSMENT AND PLAN:  1.  Syncope: This point it is unclear as to the cause of her syncope.  She does not sound orthostatic, and did not have any tachyarrhythmias or bradycardia arrhythmias on her cardiac monitor that would cause her to have syncope.  Unfortunately she did not have any syncope or near syncope while wearing the monitor.  I have told her to get a cardia mobile and to record an ECG if she has any symptoms.  If this does not work, she may require further monitoring.    Current medicines are reviewed at length with the patient today.   The patient does not have concerns regarding her medicines.  The following changes were made today:  none  Labs/ tests ordered today include:  No orders of the defined types were placed in this encounter.    Disposition:   FU with Courtney Shelton pending  monitor  Signed, Reka Wist Meredith Leeds, MD  05/02/2021 12:32 PM     [facility] [address] [place] [address] [phone] (office) [phone] (fax)

## 2021-05-02 NOTE — Patient Instructions (Signed)
Your provider has requested that you go to the basement level for lab work before leaving today. Press "B" on the elevator. The lab is located at the first door on the left as you exit the elevator.  We have sent the following medications to your pharmacy for you to pick up at your convenience: Zofran, Omeprazole, Dicyclomine   You have been scheduled for an abdominal ultrasound at [facility] (1st floor of hospital) on 05/09/21 at 8:00am. Please arrive 15 minutes prior to your appointment for registration. Make certain not to have anything to eat or drink 6 hours prior to your appointment. Should you need to reschedule your appointment, please contact radiology at [phone]. This test typically takes about 30 minutes to perform.   If you are age 56 or younger, your body mass index should be between 19-25. Your Body mass index is 26.3 kg/m. If this is out of the aformentioned range listed, please consider follow up with your Primary Care Provider.   ________________________________________________________  The [facility] providers would like to encourage you to use [facility] to communicate with providers for non-urgent requests or questions.  Due to long hold times on the telephone, sending your provider a message by [facility] may be a faster and more efficient way to get a response.  Please allow 48 business hours for a response.  Please remember that this is for non-urgent requests.  _______________________________________________________  Thank you for choosing me and [facility].  Dr. Rush Landmark

## 2021-05-03 ENCOUNTER — Encounter: Payer: Self-pay | Admitting: Gastroenterology

## 2021-05-03 NOTE — Progress Notes (Signed)
[facility] VISIT   Primary Care Provider [name], [place] [address] [phone]  Referring Provider [name], [place],  [place] [address] [phone]  Patient Profile: Courtney Shelton is a 20 y.o. female with a pmh significant for dysmenorrhea, previous anemia, anxiety, MDD, GERD, ?IBS-D.  The patient presents to the [facility] for an evaluation and management of problem(s) noted below:  Problem List 1. Generalized abdominal pain   2. Pyrosis   3. Nausea without vomiting   4. Bloating symptom   5. Change in bowel habit   6. Irritable bowel syndrome with diarrhea     History of Present Illness This is the patient's first visit to the outpatient [facility] clinic.  The patient states that she has had GI issues throughout her life even as a child.  She cannot recall if she has been evaluated by a pediatric gastroenterologist but if she was then it was when she was very young.  Over the course of the past year she has had increasing GI issues.  She describes a discomfort that begins in the periumbilical region as a dull pain at times that can be sharp as well as cramping in other locations.  This occurs within 15 to 60 minutes of eating.  This will last for anywhere between 30 to 60 minutes and then abate.  She has tried Tylenol at times without much effect.  She has heartburn symptoms a few times per week and has taken Tums in the past.  She has nausea on an every other day basis.  She has bloating and abdominal distention at times.  She has had diarrhea symptoms around 50% of her bowel movements.  She will have 2 bowel movements on a daily basis.  She denies any rectal bleeding.  The patient has noted that if she is anxious or nervous or depressed symptoms may be exacerbated.  She also believes that during the week of menses, she has more significant abdominal symptoms as well.   There is no family history of inflammatory bowel disease but her father deals with irritable bowel syndrome.  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  The patient does not take significant nonsteroidals or BC/Goody powders.  She has never had an upper or lower endoscopy.  GI Review of Systems Positive as above Negative for dysphagia, odynophagia, urgency, tenesmus, melena, hematochezia  Review of Systems General: Denies fevers/chills/weight loss unintentionally HEENT: Denies oral lesions Cardiovascular: Denies chest pain/palpitations Pulmonary: Denies shortness of breath/cough Gastroenterological: See HPI Genitourinary: Denies darkened urine Hematological: Denies easy bruising/bleeding Endocrine: Denies temperature intolerance Dermatological: Denies jaundice Psychological: Mood is stable Musculoskeletal: Denies new arthralgias   Medications Current Outpatient Medications  Medication Sig Dispense Refill   amitriptyline (ELAVIL) 50 MG tablet Take 1 tablet (50 mg total) by mouth at bedtime. 90 tablet 1   B Complex Vitamins (B COMPLEX PO) Take 1 capsule by mouth daily in the afternoon.     dicyclomine (BENTYL) 10 MG capsule Take 1 capsule (10 mg total) by mouth 4 (four) times daily -  before meals and at bedtime. 90 capsule 0   etonogestrel-ethinyl estradiol (NUVARING) 0.12-0.015 MG/24HR vaginal ring INSERT 1 RING VAGINALLY AS DIRECTED. REMOVE AFTER 3 WEEKS. WAIT 7 DAYS BEFORE INSERTING NEW RING 3 each 3   fluticasone (FLONASE) 50 MCG/ACT nasal spray Place 2 sprays into both nostrils  daily. 16 g 6   omeprazole (PRILOSEC) 40 MG capsule Take 1 capsule (40 mg total) by mouth daily. 90 capsule 3   ondansetron (ZOFRAN-ODT) 4 MG disintegrating tablet Take 1 tablet (4 mg total) by mouth every 8 (eight) hours as needed for nausea or vomiting. 20 tablet 0   vitamin C (ASCORBIC ACID) 500 MG tablet Take 500 mg by mouth daily.      Zinc Sulfate (ZINC-220 PO) Take 1 tablet by mouth daily in the afternoon.     No current facility-administered medications for this visit.    Allergies Allergies  Allergen Reactions   Zithromax [Azithromycin Dihydrate]    Zyrtec [Cetirizine Hcl]     Pt gets a really bad headache    Histories Past Medical History:  Diagnosis Date   Anemia    Constipation    Dysmenorrhea in the adolescent    Lactose intolerance    Past Surgical History:  Procedure Laterality Date   TYMPANOSTOMY TUBE PLACEMENT Bilateral    Social History   Socioeconomic History   Marital status: Single    Spouse name: Not on file   Number of children: Not on file   Years of education: Not on file   Highest education level: Not on file  Occupational History   Not on file  Tobacco Use   Smoking status: Never   Smokeless tobacco: Never  Vaping Use   Vaping Use: Never used  Substance and Sexual Activity   Alcohol use: No    Alcohol/week: 0.0 standard drinks   Drug use: No   Sexual activity: Not on file  Other Topics Concern   Not on file  Social History Narrative   Lives at home with mom dad and little sister attends [employer], she is a Museum/gallery exhibitions officer. Oralia enjoys color guard, hanging with her friends and shopping.    Social Determinants of Health   Financial Resource Strain: Not on file  Food Insecurity: Not on file  Transportation Needs: Not on file  Physical Activity: Not on file  Stress: Not on file  Social Connections: Not on file  Intimate Partner Violence: Not on file   Family History  Problem Relation Age of Onset   Thyroid disease Mother        acquired hypothyroidism, without surgery or irradiation   Anemia Mother    Irritable bowel syndrome Father    Hypertension Maternal Grandmother    Thyroid disease Maternal Grandmother        Acquired hypothyroidism late in life, without having had thyroid surgery or irradiation.    Other Maternal Grandmother        precancerous uterus    Hypertension Maternal Grandfather    Bladder Cancer Maternal Grandfather    Breast cancer Paternal Grandmother    Colon cancer Neg Hx    Stomach cancer Neg Hx    Esophageal cancer Neg Hx    Pancreatic cancer Neg Hx    I have reviewed her medical, social, and family history in detail and updated the electronic medical record as necessary.    PHYSICAL EXAMINATION  BP 124/80   Pulse 88   Ht 5\' 8"  (1.727 m)   Wt 173 lb (78.5 kg)   BMI 26.30 kg/m  Wt Readings from Last 3 Encounters:  05/02/21 174 lb 6.4 oz (79.1 kg)  05/02/21 173 lb (78.5 kg)  03/19/21 171 lb 6.1 oz (77.7 kg)  GEN: NAD, appears stated age, doesn't appear chronically ill PSYCH: Cooperative, without pressured speech EYE: Conjunctivae pink,  sclerae anicteric ENT: MMM, without oral ulcers, no erythema or exudates noted CV: RR without R/Gs  RESP: CTAB posteriorly, without wheezing GI: NABS, soft, mild TTP in midepigastrium and periumbilical region (nonradiating), nondistended, without rebound or guarding, no HSM appreciated  MSK/EXT: No lower extremity edema SKIN: No jaundice NEURO:  Alert & Oriented x 3, no focal deficits   REVIEW OF DATA  I reviewed the following data at the time of this encounter:  GI Procedures and Studies  No relevant studies to review  Laboratory Studies  Reviewed those in epic and care everywhere  Imaging Studies  No relevant studies to review   ASSESSMENT  Ms. Cappelli is a 20 y.o. female with a pmh significant for dysmenorrhea, previous anemia, anxiety, MDD, GERD, ?IBS-D.  The patient is seen today for evaluation and management of:  1. Generalized abdominal pain   2. Pyrosis   3. Nausea without vomiting   4. Bloating symptom   5. Change in bowel habit   6. Irritable bowel syndrome with diarrhea    The patient is hemodynamically stable.  Clinically, she has had persistent issues from a GI standpoint for the majority of her life although the last year she has been experiencing  new issues.  I think the patient may have underlying functional GI issues as a result of her underlying anxiety and depression but ruling out other etiologies is important.  We will plan to further evaluate her symptoms with laboratories and imaging.  She will attempt to give an H. pylori stool antigen before she heads back to college next week but if she is unable to give those stool tests, then she can go ahead and initiate PPI therapy and we will deal with the stool studies when she returns for winter break potentially.  If this work-up is unremarkable, then she may benefit from endoscopic evaluation from above and below.  We discussed initiation of a antiemetic that can be used as needed if nausea is severe.  We also discussed the potential use for a few weeks of an antispasmodic to see if that may help when she is having significant cramping/pain.  We will see how she does.  All patient questions were answered to the best of my ability, and the patient agrees to the aforementioned plan of action with follow-up as indicated.   PLAN  Laboratories as outlined below Stool studies as outlined below Fecal elastase testing to be performed SIBO breath testing to be considered Empiric IBS-D treatment with rifaximin to be considered Abdominal ultrasound to be obtained Consider cross-sectional imaging in future Zofran 4 mg 1-4 times daily as needed Dicyclomine 10 mg 1-4 times daily as needed Initiate omeprazole 40 mg daily - If able to give stool studies for H. pylori before initiation of PPI that would be ideal but if unable to since she is heading back to college next week then okay to still initiate PPI Endoscopic evaluation from above and below will be considered based on the work-up   Orders Placed This Encounter  Procedures   Helicobacter pylori special antigen   Stool Culture   Ova and parasite examination   US Abdomen Complete   Lipase   Hepatic function panel   Cortisol   Sedimentation  rate   C-reactive protein   Tissue transglutaminase, IgA   Clostridium difficile Toxin B, Qualitative, Real-Time PCR   Pancreatic elastase, fecal   IgA    New Prescriptions   DICYCLOMINE (BENTYL) 10 MG CAPSULE    Take  1 capsule (10 mg total) by mouth 4 (four) times daily -  before meals and at bedtime.   OMEPRAZOLE (PRILOSEC) 40 MG CAPSULE    Take 1 capsule (40 mg total) by mouth daily.   ONDANSETRON (ZOFRAN-ODT) 4 MG DISINTEGRATING TABLET    Take 1 tablet (4 mg total) by mouth every 8 (eight) hours as needed for nausea or vomiting.   Modified Medications   No medications on file    Planned Follow Up No follow-ups on file.   Total Time in Face-to-Face and in Coordination of Care for patient including independent/personal interpretation/review of prior testing, medical history, examination, medication adjustment, communicating results with the patient directly, and documentation within the EHR is 45 minutes.   Justice Britain, MD [facility] Office # [phone]

## 2021-05-04 LAB — IGA: Immunoglobulin A: 332 mg/dL — ABNORMAL HIGH (ref 47–310)

## 2021-05-04 LAB — TISSUE TRANSGLUTAMINASE, IGA: (tTG) Ab, IgA: <1 U/mL

## 2021-05-07 ENCOUNTER — Other Ambulatory Visit: Payer: BC Managed Care – PPO

## 2021-05-07 DIAGNOSIS — K580 Irritable bowel syndrome with diarrhea: Secondary | ICD-10-CM | POA: Insufficient documentation

## 2021-05-07 DIAGNOSIS — R110 Nausea: Secondary | ICD-10-CM | POA: Insufficient documentation

## 2021-05-07 DIAGNOSIS — R140 Abdominal distension (gaseous): Secondary | ICD-10-CM | POA: Insufficient documentation

## 2021-05-07 DIAGNOSIS — R1084 Generalized abdominal pain: Secondary | ICD-10-CM

## 2021-05-07 DIAGNOSIS — R194 Change in bowel habit: Secondary | ICD-10-CM | POA: Insufficient documentation

## 2021-05-07 DIAGNOSIS — R12 Heartburn: Secondary | ICD-10-CM | POA: Insufficient documentation

## 2021-05-09 ENCOUNTER — Ambulatory Visit (HOSPITAL_COMMUNITY): Payer: BC Managed Care – PPO

## 2021-05-11 LAB — STOOL CULTURE: E coli, Shiga toxin Assay: NEGATIVE

## 2021-05-16 LAB — HELICOBACTER PYLORI  SPECIAL ANTIGEN
MICRO NUMBER:: 12683590
SPECIMEN QUALITY: ADEQUATE

## 2021-05-16 LAB — OVA AND PARASITE EXAMINATION
CONCENTRATE RESULT:: NONE SEEN
MICRO NUMBER:: 12683603
SPECIMEN QUALITY:: ADEQUATE
TRICHROME RESULT:: NONE SEEN

## 2021-05-16 LAB — PANCREATIC ELASTASE, FECAL: Pancreatic Elastase-1, Stool: >500 mcg/g

## 2021-05-16 LAB — CLOSTRIDIUM DIFFICILE TOXIN B, QUALITATIVE, REAL-TIME PCR: Toxigenic C. Difficile by PCR: NOT DETECTED

## 2021-05-21 ENCOUNTER — Telehealth: Payer: Self-pay | Admitting: Gastroenterology

## 2021-05-21 NOTE — Telephone Encounter (Signed)
I have faxed the order to [facility] as requested by the pt .  I have also asked them to fax results to our office.  The pt has been advised that she can call at her convenience for an appt

## 2021-05-21 NOTE — Telephone Encounter (Signed)
So sorry.. This is the correct patient for the Abdominal Ultrasound for [facility] in [place] for possibly Friday at 32.

## 2021-05-25 DIAGNOSIS — R109 Unspecified abdominal pain: Secondary | ICD-10-CM | POA: Diagnosis not present

## 2021-05-27 ENCOUNTER — Other Ambulatory Visit: Payer: Self-pay | Admitting: Gastroenterology

## 2021-06-05 ENCOUNTER — Encounter: Payer: Self-pay | Admitting: Gastroenterology

## 2021-06-05 NOTE — Progress Notes (Signed)
Review of outside ultrasound  Sonographic images of the abdomen were obtained.  The visualized portions of the pancreas are within normal limits.  The proximal aorta measures 1.7 cm in the distal aorta measures 1.1 cm.  IVC is within normal limits.  No gallstones visualized within the gallbladder.  Common bile duct is within normal limits measuring 0.1 cm.  No evidence of gallbladder wall thickening measuring point to 2.3 cm.  Liver demonstrates a normal echotexture is within normal limits.  Right kidney measures 11.2 x 5.4 x 5.0 cm.  Left kidney measures 12.6 x 6.6 x 5.2 cm and is within normal limits.  Spleen measures 11.2 cm and is within normal limits.  Overall, normal abdominal ultrasound as above.  No evidence of cholelithiasis with no sonographic findings of acute cholecystitis.

## 2021-06-21 DIAGNOSIS — H0014 Chalazion left upper eyelid: Secondary | ICD-10-CM | POA: Diagnosis not present

## 2021-06-21 DIAGNOSIS — B354 Tinea corporis: Secondary | ICD-10-CM | POA: Diagnosis not present

## 2021-07-25 ENCOUNTER — Other Ambulatory Visit: Payer: Self-pay | Admitting: Physician Assistant

## 2021-12-20 ENCOUNTER — Encounter: Payer: Self-pay | Admitting: Gastroenterology

## 2021-12-20 NOTE — Telephone Encounter (Signed)
Dr Rush Landmark please see the message from the pt and advise

## 2021-12-21 NOTE — Telephone Encounter (Signed)
Patty, Thank you for sending this to me. I am sorry to hear about the patient still having issues.  This is the first time we have heard from her in almost 8 months. We had considered SIBO breath testing in the past.  We had also considered empiric IBS-D treatment with Xifaxan. Cross-sectional imaging was going to be considered if issues were still persisting. Consideration of an endoscopic evaluation from above and below was also to be considered pending her findings.  I think there are many different routes that we could move forward and I am not sure how aggressive she wants to be:  1) would recommend trying to do Prilosec once daily at least for a period in time. 2) if Bentyl was not helping we could trial Levsin and if she wants that we can certainly send in a prescription to see if that makes any difference 3) patient can be offered endoscopic evaluation 4) patient can be offered SIBO breath testing 5) patient can be offered IBS-D Xifaxan therapy x2 weeks  Let me know what she decides when you talk with her.  Thanks. GM

## 2021-12-24 ENCOUNTER — Telehealth: Payer: Self-pay

## 2021-12-24 NOTE — Telephone Encounter (Signed)
Mansouraty, Telford Nab., MD  You 3 days ago    14, Thank you for sending this to me. I am sorry to hear about the patient still having issues.  This is the first time we have heard from her in almost 8 months. We had considered SIBO breath testing in the past.  We had also considered empiric IBS-D treatment with Xifaxan. Cross-sectional imaging was going to be considered if issues were still persisting. Consideration of an endoscopic evaluation from above and below was also to be considered pending her findings.   I think there are many different routes that we could move forward and I am not sure how aggressive she wants to be:   1) would recommend trying to do Prilosec once daily at least for a period in time. 2) if Bentyl was not helping we could trial Levsin and if she wants that we can certainly send in a prescription to see if that makes any difference 3) patient can be offered endoscopic evaluation 4) patient can be offered SIBO breath testing 5) patient can be offered IBS-D Xifaxan therapy x2 weeks   Let me know what she decides when you talk with her.   Thanks. GM      Note    You routed conversation to Mansouraty, Telford Nab., MD 4 days ago   You 4 days ago    Dr Rush Landmark please see the message from the pt and advise       Note    Loistine Guidry  [facility] (supporting Irving Copas., MD) 4 days ago   AB My gas also smells very bad.     Kaelynne Baughman  [facility] (supporting Irving Copas., MD) 4 days ago   AB Also, I often feel bloated after eating.    Anjel Willet  [facility] (supporting Irving Copas., MD) 4 days ago   AB Okay great, thank you.  The pains are pretty consistent but of course there are times that I'm not in the normal pain. They're normally after I eat but lately they have also been before I've eaten. Recently, I have had sharp pain about 2 inches or so above my belly button  but I also have consistent lower abdominal pain that is more of an ache than sharp. I haven't noticed a difference in sides. My bowel movements are either normal or terrible diarrhea and sometimes where I'm almost in tears and I feel like I'm going to pass out and have no way of controlling it and can't get off the toilet. These aren't super common but aren't unusual for me. I'm nauseous quite often but especially after I eat. I have nausea or pain with pretty much everything I eat but gluten and dairy definitely exacerbate it. The bentyl didn't do much and I tried to not take the Prilosec cause I've heard it makes it worse overtime. And the Zofran supposedly increases heart issues when taken with my amitriptyline and since I already have palpitations I was scared to take it.     You  Duwayne Heck 4 days ago    Thank you I see that now.  I will send to Dr Rush Landmark and see what his thoughts are. Where are the pains you are having?  Upper/lower, more to one side than the other? How are your bowel movements? Do you experience the nausea in relation to meals? Are you still taking Bentyl, prilosec, and zofran? If so,  do they help at all?      Yoko Jersey  [facility] (supporting Irving Copas., MD) 4 days ago   AB I received the ultrasound at another office since I was in college outside of [place], but the results were sent to Dr. Rush Landmark as well I believe.     You  Duwayne Heck 4 days ago    It looks like you were scheduled for an [place] but did not keep the appointment.  I believe we need to get you rescheduled for that first.  Is this ok?    Suriyah Rohman  [facility] Clinical Pool (supporting Irving Copas., MD) 4 days ago   AB Hello,    I am still in pain and having nausea following my last visit. The past few days I have had constant abdominal pain and nothing has been able to fix it. Since the tests that were done didn't show much of anything, would  it be possible/ reasonable to set up a time for an endoscopy?    Thank you.  Juanice

## 2021-12-24 NOTE — Telephone Encounter (Signed)
Patty, Go ahead and offer her EGD/colonoscopy in the [facility]. Go ahead and offer her SIBO breath testing and she can come and pick up the breath test and complete as soon as able. Thanks. GM

## 2021-12-24 NOTE — Telephone Encounter (Signed)
Dr Rush Landmark see the response from the pt

## 2021-12-24 NOTE — Telephone Encounter (Signed)
Message sent to the pt My Chart with Dr Rush Landmark response and her preference of next steps.

## 2021-12-25 ENCOUNTER — Encounter: Payer: Self-pay | Admitting: Gastroenterology

## 2021-12-27 ENCOUNTER — Ambulatory Visit (AMBULATORY_SURGERY_CENTER): Payer: BC Managed Care – PPO

## 2021-12-27 VITALS — Ht 68.0 in | Wt 172.0 lb

## 2021-12-27 DIAGNOSIS — R1084 Generalized abdominal pain: Secondary | ICD-10-CM

## 2021-12-27 DIAGNOSIS — R194 Change in bowel habit: Secondary | ICD-10-CM

## 2021-12-27 DIAGNOSIS — K580 Irritable bowel syndrome with diarrhea: Secondary | ICD-10-CM

## 2021-12-27 DIAGNOSIS — R140 Abdominal distension (gaseous): Secondary | ICD-10-CM

## 2021-12-27 DIAGNOSIS — R110 Nausea: Secondary | ICD-10-CM

## 2021-12-27 NOTE — Progress Notes (Signed)
No egg or soy allergy known to patient  No issues known to pt with past sedation with any surgeries or procedures Patient denies ever being  intubated in the past,     No FH of Malignant Hyperthermia Pt is not on diet pills Pt is not on  home 02  Pt is not on blood thinners  Pt denies issues with constipation  No A fib or A flutter   Miralax prep  Instructions mailed and sent via [employer]

## 2022-01-03 ENCOUNTER — Other Ambulatory Visit: Payer: Self-pay | Admitting: Physician Assistant

## 2022-01-11 ENCOUNTER — Encounter: Payer: Self-pay | Admitting: Gastroenterology

## 2022-01-13 DIAGNOSIS — R1084 Generalized abdominal pain: Secondary | ICD-10-CM | POA: Diagnosis not present

## 2022-01-13 DIAGNOSIS — R110 Nausea: Secondary | ICD-10-CM | POA: Diagnosis not present

## 2022-01-13 DIAGNOSIS — R12 Heartburn: Secondary | ICD-10-CM | POA: Diagnosis not present

## 2022-01-13 DIAGNOSIS — R140 Abdominal distension (gaseous): Secondary | ICD-10-CM | POA: Diagnosis not present

## 2022-01-15 ENCOUNTER — Encounter: Payer: Self-pay | Admitting: Gastroenterology

## 2022-01-15 ENCOUNTER — Ambulatory Visit (AMBULATORY_SURGERY_CENTER): Payer: BC Managed Care – PPO | Admitting: Gastroenterology

## 2022-01-15 VITALS — BP 105/60 | HR 67 | Temp 98.7°F | Resp 14 | Ht 68.0 in | Wt 172.0 lb

## 2022-01-15 DIAGNOSIS — D122 Benign neoplasm of ascending colon: Secondary | ICD-10-CM | POA: Diagnosis not present

## 2022-01-15 DIAGNOSIS — R194 Change in bowel habit: Secondary | ICD-10-CM

## 2022-01-15 DIAGNOSIS — K640 First degree hemorrhoids: Secondary | ICD-10-CM | POA: Diagnosis not present

## 2022-01-15 DIAGNOSIS — R110 Nausea: Secondary | ICD-10-CM

## 2022-01-15 DIAGNOSIS — K635 Polyp of colon: Secondary | ICD-10-CM | POA: Diagnosis not present

## 2022-01-15 DIAGNOSIS — R1030 Lower abdominal pain, unspecified: Secondary | ICD-10-CM

## 2022-01-15 DIAGNOSIS — R12 Heartburn: Secondary | ICD-10-CM | POA: Diagnosis not present

## 2022-01-15 DIAGNOSIS — R197 Diarrhea, unspecified: Secondary | ICD-10-CM

## 2022-01-15 DIAGNOSIS — R1013 Epigastric pain: Secondary | ICD-10-CM | POA: Diagnosis not present

## 2022-01-15 HISTORY — PX: COLONOSCOPY: SHX174

## 2022-01-15 MED ORDER — SODIUM CHLORIDE 0.9 % IV SOLN: 500.0000 mL | Freq: Once | INTRAVENOUS | Status: DC

## 2022-01-15 NOTE — Progress Notes (Signed)
Called to room to assist during endoscopic procedure.  Patient ID and intended procedure confirmed with present staff. Received instructions for my participation in the procedure from the performing physician.  

## 2022-01-15 NOTE — Op Note (Signed)
[place] [facility] Patient Name: Courtney Shelton Procedure Date: 01/15/2022 2:17 PM MRN: [id number] Endoscopist: Justice Britain , MD Age: 21 Referring MD:  Date of Birth: 17-Jul-2000 Gender: Female Account #: [id number] Procedure:                Colonoscopy Indications:              Lower abdominal pain, Change in bowel habits,                            Diarrhea Medicines:                Monitored Anesthesia Care Procedure:                Pre-Anesthesia Assessment:                           - Prior to the procedure, a History and Physical                            was performed, and patient medications and                            allergies were reviewed. The patient's tolerance of                            previous anesthesia was also reviewed. The risks                            and benefits of the procedure and the sedation                            options and risks were discussed with the patient.                            All questions were answered, and informed consent                            was obtained. Prior Anticoagulants: The patient has                            taken no previous anticoagulant or antiplatelet                            agents. ASA Grade Assessment: II - A patient with                            mild systemic disease. After reviewing the risks                            and benefits, the patient was deemed in                            satisfactory condition to undergo the procedure.  After obtaining informed consent, the colonoscope                            was passed under direct vision. Throughout the                            procedure, the patient's blood pressure, pulse, and                            oxygen saturations were monitored continuously. The                            Olympus CF-HQ190L (Serial# [id number]) Colonoscope was                            introduced through the anus and advanced to the 5                             cm into the ileum. The colonoscopy was performed                            without difficulty. The patient tolerated the                            procedure. The quality of the bowel preparation was                            good. The terminal ileum, ileocecal valve,                            appendiceal orifice, and rectum were photographed. Scope In: 2:20:36 PM Scope Out: 2:33:08 PM Scope Withdrawal Time: 0 hours 10 minutes 36 seconds  Total Procedure Duration: 0 hours 12 minutes 32 seconds  Findings:                 The digital rectal exam was normal. Pertinent                            negatives include no palpable rectal lesions.                           The terminal ileum and ileocecal valve appeared                            normal.                           A 5 mm polyp was found in the ascending colon. The                            polyp was sessile. The polyp was removed with a                            cold snare. Resection and retrieval were complete.  Normal mucosa was found in the entire colon                            otherwise. Biopsies for histology were taken with a                            cold forceps from the entire colon for evaluation                            of microscopic colitis.                           Non-bleeding non-thrombosed internal hemorrhoids                            were found during retroflexion, during perianal                            exam and during digital exam. The hemorrhoids were                            Grade I (internal hemorrhoids that do not prolapse). Complications:            No immediate complications. Estimated Blood Loss:     Estimated blood loss was minimal. Impression:               - The examined portion of the ileum was normal.                           - One 5 mm polyp in the ascending colon, removed                            with a cold snare. Resected and  retrieved.                           - Normal mucosa in the entire examined colon                            otherwise. Biopsied.                           - Non-bleeding non-thrombosed internal hemorrhoids. Recommendation:           - The patient will be observed post-procedure,                            until all discharge criteria are met.                           - Discharge patient to home.                           - Patient has a contact number available for  emergencies. The signs and symptoms of potential                            delayed complications were discussed with the                            patient. Return to normal activities tomorrow.                            Written discharge instructions were provided to the                            patient.                           - High fiber diet.                           - Use FiberCon 1-2 tablets PO daily.                           - Continue present medications.                           - Await pathology results.                           - Repeat colonoscopy in 3 years for surveillance                            based on pathology results (due to young age if                            this is an adenomatous polyp on final pathology).                            Otherwise, 10 year colonoscopy will be considered.                           - Consideration of SIBO breath testing in future.                           - Consideration of IBS-D empiric Xifaxin therapy in                            future.                           - IBgard therapy can be trialed as well.                           - The findings and recommendations were discussed                            with the patient.                           -  The findings and recommendations were discussed                            with the patient's family. Justice Britain, MD 01/15/2022 2:45:01 PM

## 2022-01-15 NOTE — Progress Notes (Signed)
Pt's states no medical or surgical changes since previsit or office visit. 

## 2022-01-15 NOTE — Patient Instructions (Addendum)
Upper Endoscopy: handout on gastritis given to you today  Await biopsy results     Colonoscopy:   Follow high fiber diet   Use FiberCon 1-2 tablets daily  Await pathology results on biopsies done   Handout on polyps given to you today  Await pathology results on polyp removed   Other considerations or plan will be setermined when pathology results back- see procedure report    YOU HAD AN ENDOSCOPIC PROCEDURE TODAY AT [place]:   Refer to the procedure report that was given to you for any specific questions about what was found during the examination.  If the procedure report does not answer your questions, please call your gastroenterologist to clarify.  If you requested that your care partner not be given the details of your procedure findings, then the procedure report has been included in a sealed envelope for you to review at your convenience later.  YOU SHOULD EXPECT: Some feelings of bloating in the abdomen. Passage of more gas than usual.  Walking can help get rid of the air that was put into your GI tract during the procedure and reduce the bloating. If you had a lower endoscopy (such as a colonoscopy or flexible sigmoidoscopy) you may notice spotting of blood in your stool or on the toilet paper. If you underwent a bowel prep for your procedure, you may not have a normal bowel movement for a few days.  Please Note:  You might notice some irritation and congestion in your nose or some drainage.  This is from the oxygen used during your procedure.  There is no need for concern and it should clear up in a day or so.  SYMPTOMS TO REPORT IMMEDIATELY:  Following lower endoscopy (colonoscopy or flexible sigmoidoscopy):  Excessive amounts of blood in the stool  Significant tenderness or worsening of abdominal pains  Swelling of the abdomen that is new, acute  Fever of 100F or higher  Following upper endoscopy (EGD)  Vomiting of blood or coffee ground  material  New chest pain or pain under the shoulder blades  Painful or persistently difficult swallowing  New shortness of breath  Fever of 100F or higher  Black, tarry-looking stools  For urgent or emergent issues, a gastroenterologist can be reached at any hour by calling [phone]. Do not use MyChart messaging for urgent concerns.    DIET:  We do recommend a small meal at first, but then you may proceed to your regular diet.  Drink plenty of fluids but you should avoid alcoholic beverages for 24 hours.  ACTIVITY:  You should plan to take it easy for the rest of today and you should NOT DRIVE or use heavy machinery until tomorrow (because of the sedation medicines used during the test).    FOLLOW UP: Our staff will call the number listed on your records the next business day following your procedure.  We will call around 7:15- 8:00 am to check on you and address any questions or concerns that you may have regarding the information given to you following your procedure. If we do not reach you, we will leave a message.  If you develop any symptoms (ie: fever, flu-like symptoms, shortness of breath, cough etc.) before then, please call [phone].  If you test positive for Covid 19 in the 2 weeks post procedure, please call and report this information to [place].    If any biopsies were taken you will be contacted by phone or by  letter within the next 1-3 weeks.  Please call us at [phone] if you have not heard about the biopsies in 3 weeks.    SIGNATURES/CONFIDENTIALITY: You and/or your care partner have signed paperwork which will be entered into your electronic medical record.  These signatures attest to the fact that that the information above on your After Visit Summary has been reviewed and is understood.  Full responsibility of the confidentiality of this discharge information lies with you and/or your care-partner.

## 2022-01-15 NOTE — Progress Notes (Signed)
GASTROENTEROLOGY PROCEDURE H&P NOTE   Primary Care Physician: [name], PA  HPI: Courtney Shelton is a 21 y.o. female who presents for EGD/colonoscopy for evaluation of abdominal pain, bloating, diarrhea, pyrosis, ?IBS.  Past Medical History:  Diagnosis Date   Anemia    Anxiety    Constipation    Depression    Dysmenorrhea in the adolescent    GERD (gastroesophageal reflux disease)    Lactose intolerance    Migraine    Syncope, near 2019   occasional, states she will feel it coming on and will sit down   Past Surgical History:  Procedure Laterality Date   TYMPANOSTOMY TUBE PLACEMENT Bilateral 2002   WISDOM TOOTH EXTRACTION  2016   Current Outpatient Medications  Medication Sig Dispense Refill   amitriptyline (ELAVIL) 50 MG tablet Take 1 tablet (50 mg total) by mouth at bedtime. (Patient not taking: Reported on 12/27/2021) 90 tablet 1   B Complex Vitamins (B COMPLEX PO) Take 1 capsule by mouth daily in the afternoon.     dicyclomine (BENTYL) 10 MG capsule TAKE 1 CAPSULE (10 MG TOTAL) BY MOUTH 4 (FOUR) TIMES DAILY - BEFORE MEALS AND AT BEDTIME. (Patient not taking: Reported on 12/27/2021) 90 capsule 0   etonogestrel-ethinyl estradiol (NUVARING) 0.12-0.015 MG/24HR vaginal ring INSERT 1 RING VAGINALLY AS DIRECTED. REMOVE AFTER 3 WEEKS. WAIT 7 DAYS BEFORE INSERTING NEW RING 3 each 1   fluticasone (FLONASE) 50 MCG/ACT nasal spray Place 2 sprays into both nostrils daily. 16 g 6   omeprazole (PRILOSEC) 40 MG capsule Take 1 capsule (40 mg total) by mouth daily. (Patient not taking: Reported on 12/27/2021) 90 capsule 3   ondansetron (ZOFRAN-ODT) 4 MG disintegrating tablet Take 1 tablet (4 mg total) by mouth every 8 (eight) hours as needed for nausea or vomiting. 20 tablet 0   vitamin C (ASCORBIC ACID) 500 MG tablet Take 500 mg by mouth daily.     Zinc Sulfate (ZINC-220 PO) Take 1 tablet by mouth daily in the afternoon.     Current Facility-Administered Medications  Medication  Dose Route Frequency Provider Last Rate Last Admin   0.9 %  sodium chloride infusion  500 mL Intravenous Once Mansouraty, Telford Nab., MD        Current Outpatient Medications:    amitriptyline (ELAVIL) 50 MG tablet, Take 1 tablet (50 mg total) by mouth at bedtime. (Patient not taking: Reported on 12/27/2021), Disp: 90 tablet, Rfl: 1   B Complex Vitamins (B COMPLEX PO), Take 1 capsule by mouth daily in the afternoon., Disp: , Rfl:    dicyclomine (BENTYL) 10 MG capsule, TAKE 1 CAPSULE (10 MG TOTAL) BY MOUTH 4 (FOUR) TIMES DAILY - BEFORE MEALS AND AT BEDTIME. (Patient not taking: Reported on 12/27/2021), Disp: 90 capsule, Rfl: 0   etonogestrel-ethinyl estradiol (NUVARING) 0.12-0.015 MG/24HR vaginal ring, INSERT 1 RING VAGINALLY AS DIRECTED. REMOVE AFTER 3 WEEKS. WAIT 7 DAYS BEFORE INSERTING NEW RING, Disp: 3 each, Rfl: 1   fluticasone (FLONASE) 50 MCG/ACT nasal spray, Place 2 sprays into both nostrils daily., Disp: 16 g, Rfl: 6   omeprazole (PRILOSEC) 40 MG capsule, Take 1 capsule (40 mg total) by mouth daily. (Patient not taking: Reported on 12/27/2021), Disp: 90 capsule, Rfl: 3   ondansetron (ZOFRAN-ODT) 4 MG disintegrating tablet, Take 1 tablet (4 mg total) by mouth every 8 (eight) hours as needed for nausea or vomiting., Disp: 20 tablet, Rfl: 0   vitamin C (ASCORBIC ACID) 500 MG tablet, Take 500 mg by mouth daily.,  Disp: , Rfl:    Zinc Sulfate (ZINC-220 PO), Take 1 tablet by mouth daily in the afternoon., Disp: , Rfl:   Current Facility-Administered Medications:    0.9 %  sodium chloride infusion, 500 mL, Intravenous, Once, Mansouraty, Telford Nab., MD Allergies  Allergen Reactions   Zithromax [Azithromycin Dihydrate]    Zyrtec [Cetirizine Hcl]     Pt gets a really bad headache   Family History  Problem Relation Age of Onset   Thyroid disease Mother        acquired hypothyroidism, without surgery or irradiation   Anemia Mother    Irritable bowel syndrome Father    Hypertension Maternal  Grandmother    Thyroid disease Maternal Grandmother        Acquired hypothyroidism late in life, without having had thyroid surgery or irradiation.    Other Maternal Grandmother        precancerous uterus   Hypertension Maternal Grandfather    Bladder Cancer Maternal Grandfather    Breast cancer Paternal Grandmother    Colon cancer Neg Hx    Stomach cancer Neg Hx    Esophageal cancer Neg Hx    Pancreatic cancer Neg Hx    Colon polyps Neg Hx    Rectal cancer Neg Hx    Social History   Socioeconomic History   Marital status: Single    Spouse name: Not on file   Number of children: Not on file   Years of education: Not on file   Highest education level: Not on file  Occupational History   Not on file  Tobacco Use   Smoking status: Never   Smokeless tobacco: Never  Vaping Use   Vaping Use: Never used  Substance and Sexual Activity   Alcohol use: Yes    Comment: 0-2 drinks per week , seltzer   Drug use: Never   Sexual activity: Yes    Comment: novaring  Other Topics Concern   Not on file  Social History Narrative   Lives at home with mom dad and little sister attends [employer], she is a Museum/gallery exhibitions officer. Yanelly enjoys color guard, hanging with her friends and shopping.    Social Determinants of Health   Financial Resource Strain: Not on file  Food Insecurity: Not on file  Transportation Needs: Not on file  Physical Activity: Not on file  Stress: Not on file  Social Connections: Not on file  Intimate Partner Violence: Not on file    Physical Exam: Today's Vitals   01/15/22 1318 01/15/22 1319  Pulse: 68   Temp: 98.7 F (37.1 C) 98.7 F (37.1 C)  TempSrc: Temporal   SpO2: 96%   Weight: 172 lb (78 kg)   Height: '5\' 8"'$  (1.727 m)    Body mass index is 26.15 kg/m. GEN: NAD EYE: Sclerae anicteric ENT: MMM CV: Non-tachycardic GI: Soft, TTP in MEG NEURO:  Alert & Oriented x 3  Lab Results: No results for input(s): "WBC", "HGB", "HCT", "PLT" in the last 72 hours. BMET No  results for input(s): "NA", "K", "CL", "CO2", "GLUCOSE", "BUN", "CREATININE", "CALCIUM" in the last 72 hours. LFT No results for input(s): "PROT", "ALBUMIN", "AST", "ALT", "ALKPHOS", "BILITOT", "BILIDIR", "IBILI" in the last 72 hours. PT/INR No results for input(s): "LABPROT", "INR" in the last 72 hours.   Impression / Plan: This is a 21 y.o.female who presents for EGD/colonoscopy for evaluation of abdominal pain, bloating, diarrhea, pyrosis, ?IBS.  The risks and benefits of endoscopic evaluation/treatment were discussed with the patient and/or family;  these include but are not limited to the risk of perforation, infection, bleeding, missed lesions, lack of diagnosis, severe illness requiring hospitalization, as well as anesthesia and sedation related illnesses.  The patient's history has been reviewed, patient examined, no change in status, and deemed stable for procedure.  The patient and/or family is agreeable to proceed.    Justice Britain, MD [facility] Office # [phone]

## 2022-01-15 NOTE — Op Note (Signed)
[facility] Patient Name: Courtney Shelton Procedure Date: 01/15/2022 1:57 PM MRN: [id number] Endoscopist: Justice Britain , MD Age: 21 Referring MD:  Date of Birth: December 03, 2000 Gender: Female Account #: [id number] Procedure:                Upper GI endoscopy Indications:              Epigastric abdominal pain, Lower abdominal pain,                            Heartburn, Diarrhea Medicines:                Monitored Anesthesia Care Procedure:                Pre-Anesthesia Assessment:                           - Prior to the procedure, a History and Physical                            was performed, and patient medications and                            allergies were reviewed. The patient's tolerance of                            previous anesthesia was also reviewed. The risks                            and benefits of the procedure and the sedation                            options and risks were discussed with the patient.                            All questions were answered, and informed consent                            was obtained. Prior Anticoagulants: The patient has                            taken no previous anticoagulant or antiplatelet                            agents. ASA Grade Assessment: II - A patient with                            mild systemic disease. After reviewing the risks                            and benefits, the patient was deemed in                            satisfactory condition to undergo the procedure.  After obtaining informed consent, the endoscope was                            passed under direct vision. Throughout the                            procedure, the patient's blood pressure, pulse, and                            oxygen saturations were monitored                            continuously.After obtaining informed consent, the                            endoscope was passed under direct vision.                             Throughout the procedure, the patient's blood                            pressure, pulse, and oxygen saturations were                            monitored continuously. The Endoscope was                            introduced through the mouth, and advanced to the                            second part of duodenum. The upper GI endoscopy was                            accomplished without difficulty. The patient                            tolerated the procedure. Scope In: Scope Out: Findings:                 No gross lesions were noted in the entire                            esophagus. Biopsies were taken with a cold forceps                            for histology.                           The Z-line was irregular and was found 40 cm from                            the incisors.                           Patchy mildly erythematous mucosa without bleeding  was found in the entire examined stomach. Biopsies                            were taken with a cold forceps for histology and                            Helicobacter pylori testing.                           No gross lesions were noted in the duodenal bulb,                            in the first portion of the duodenum and in the                            second portion of the duodenum. Biopsies were taken                            with a cold forceps for histology. Complications:            No immediate complications. Estimated blood loss:                            None. Estimated Blood Loss:     Estimated blood loss was minimal. Impression:               - No gross lesions in esophagus. Biopsied.                           - Z-line irregular, 40 cm from the incisors.                           - Erythematous mucosa in the stomach. Biopsied.                           - No gross lesions in the duodenal bulb, in the                            first portion of the duodenum and in the  second                            portion of the duodenum. Biopsied. Recommendation:           - Proceed to scheduled colonoscopy.                           - Continue present medications.                           - Await pathology results.                           - Consider PPI empiric trial v H2RA therapy for  pyrosis (heartburn symptoms).                           - The findings and recommendations were discussed                            with the patient.                           - The findings and recommendations were discussed                            with the patient's family. Justice Britain, MD 01/15/2022 2:39:41 PM

## 2022-01-15 NOTE — Progress Notes (Signed)
A and O x3. Report to RN. Tolerated MAC anesthesia well.Teeth unchanged after procedure. 

## 2022-01-16 ENCOUNTER — Telehealth: Payer: Self-pay | Admitting: *Deleted

## 2022-01-16 DIAGNOSIS — H01001 Unspecified blepharitis right upper eyelid: Secondary | ICD-10-CM | POA: Diagnosis not present

## 2022-01-16 DIAGNOSIS — H01004 Unspecified blepharitis left upper eyelid: Secondary | ICD-10-CM | POA: Diagnosis not present

## 2022-01-16 DIAGNOSIS — H0014 Chalazion left upper eyelid: Secondary | ICD-10-CM | POA: Diagnosis not present

## 2022-01-16 DIAGNOSIS — H01002 Unspecified blepharitis right lower eyelid: Secondary | ICD-10-CM | POA: Diagnosis not present

## 2022-01-16 NOTE — Telephone Encounter (Signed)
Thank you for coming this to me as the on-call physician for the evening.  It is Dr. Donneta Romberg patient, so I will copy him on this as well.  She had an uneventful upper endoscopy yesterday, and is likely having localized irritation from the endoscope.  She can gargle salt water and use Tylenol or ibuprofen for relief, and I expect that it will pass.  - H. Loletha Carrow, MD

## 2022-01-16 NOTE — Telephone Encounter (Signed)
Attempted to call patient for their post-procedure follow-up call. No answer. Left voicemail.   

## 2022-01-16 NOTE — Telephone Encounter (Signed)
Patient returned the call requesting to speak with a nurse.

## 2022-01-16 NOTE — Telephone Encounter (Signed)
Returned pt's call. States she is having indigestion/pressure sensation in her throat down to her chest when she swallows, even when just swallowing saliva. States this is different than the symptoms she was having prior to the procedure. RN instructed that it may just be some irritation from the procedure, but will make MD aware and call her back with any further recommendations.

## 2022-01-16 NOTE — Telephone Encounter (Signed)
Called and spoke with patient this afternoon. She is describing an acid indigestion discomfort that is "different" than what she is experienced previously. She is also feeling difficulty with being able to swallow even her saliva although she was able to tolerate a diet earlier today. That discomfort is right at the very top of her esophagus at the back of her throat. We did not perform any dilation and this was just a diagnostic upper endoscopy before she had her colonoscopy performed. I suspect that this is more likely functional discomfort but in the off chance that there is something more I have asked her to do the following: 1) trial Mylanta or Pepcid or Tums tonight into tomorrow to see if we can counteract what could be some acid indigestion 2) we will have my team reach out to the patient in the morning and if she is still experiencing issues she should be ordered a neck x-ray and chest x-ray and KUB to ensure we rule out any complications from yesterday's diagnostic endoscopy and colonoscopy 3) she should initiate once daily PPI as I had previously discussed or at least continue H2 RA therapy 4) hopefully she is just feeling better and no additional workup will be required until her biopsies returned. 5) Cepacol lozenges or Chloraseptic can be used for sore throat right now  She understands that if things progress or worsen she will need to call us back and we will recommend evaluation in the emergency department.  She was appreciative for the call back.  Patty, Please call the patient tomorrow morning and see how she is doing. Thanks. GM

## 2022-01-17 NOTE — Telephone Encounter (Signed)
Thanks. GM 

## 2022-01-17 NOTE — Telephone Encounter (Signed)
Dr Rush Landmark I spoke with the pt and she tells me that she is doing much better.  No 100 % but she is definitely going in the right direction.  She will call if she does not continue to improve.

## 2022-01-18 ENCOUNTER — Encounter: Payer: Self-pay | Admitting: Gastroenterology

## 2022-01-24 ENCOUNTER — Telehealth: Payer: Self-pay | Admitting: Gastroenterology

## 2022-01-24 NOTE — Telephone Encounter (Signed)
Refaxed form to [phone] and called the office to make them aware that the form was refaxed and was given an alternate number to fax to as well.  [phone].  Form faxed to that number also.

## 2022-01-24 NOTE — Telephone Encounter (Signed)
Kyle from [facility] called stating that they are missing the form for patients SIBO test and is requesting a call back. Please advise.   [phone]

## 2022-01-29 ENCOUNTER — Encounter: Payer: Self-pay | Admitting: Gastroenterology

## 2022-01-29 NOTE — Progress Notes (Signed)
Review of [facility] SIBO breath test  Hydrogen 33 ppm Methane 14 ppm Increasing combined hydrogen and methane 47 ppm Analysis suggest bacterial overgrowth is suspected We will have these results scanned into the chart.  We will reach out to the patient and offer her a course of SIBO treatment with rifaximin in an effort of trying to treat her symptoms.  Xifaxan 550 mg 3 times daily x14 days would be initial treatment. If costs come into the picture then we will consider Augmentin or Flagyl use.  Patty, Please reach out to patient and let her know the results and move forward with prescription.  Justice Britain, MD [facility] Office # [phone]

## 2022-02-06 DIAGNOSIS — H01001 Unspecified blepharitis right upper eyelid: Secondary | ICD-10-CM | POA: Diagnosis not present

## 2022-02-06 DIAGNOSIS — H01002 Unspecified blepharitis right lower eyelid: Secondary | ICD-10-CM | POA: Diagnosis not present

## 2022-02-06 DIAGNOSIS — H01004 Unspecified blepharitis left upper eyelid: Secondary | ICD-10-CM | POA: Diagnosis not present

## 2022-02-06 DIAGNOSIS — H01005 Unspecified blepharitis left lower eyelid: Secondary | ICD-10-CM | POA: Diagnosis not present

## 2022-03-04 ENCOUNTER — Encounter: Payer: Self-pay | Admitting: *Deleted

## 2022-03-05 ENCOUNTER — Telehealth: Payer: Self-pay | Admitting: Gastroenterology

## 2022-03-05 NOTE — Telephone Encounter (Signed)
Patient called regarding her SIBO test results.Please advise

## 2022-03-05 NOTE — Telephone Encounter (Signed)
No hurry thank you

## 2022-03-05 NOTE — Telephone Encounter (Signed)
Courtney Shelton have you seen any SIBO results for this pt?

## 2022-03-07 NOTE — Telephone Encounter (Signed)
Left message for [facility] to return call.

## 2022-03-07 NOTE — Telephone Encounter (Signed)
Spoke with Courtney Shelton at [facility] and inquired about SIBO lactulose breath test results.  Courtney Shelton stated that test kit had been received in August and that the results would be faxed to [phone].  Will wait for results.

## 2022-03-11 ENCOUNTER — Encounter: Payer: Self-pay | Admitting: Physician Assistant

## 2022-03-12 ENCOUNTER — Other Ambulatory Visit: Payer: Self-pay

## 2022-03-12 MED ORDER — RIFAXIMIN 550 MG PO TABS: 550.0000 mg | ORAL_TABLET | Freq: Three times a day (TID) | ORAL | 0 refills | Status: AC

## 2022-03-12 NOTE — Telephone Encounter (Signed)
Prescription has been sent to the pharmacy  Left message on machine to call back

## 2022-03-12 NOTE — Telephone Encounter (Signed)
I have reviewed the results from [facility] Hydrogen 33 ppm Methane 14 ppm Combine hydrogen and methane 47 ppm (high) Analysis suggest bacterial overgrowth is likely  Patient will benefit from treatment. Recommend Xifaxan 550 mg 3 times daily x14 days. We will have my team reach out to the patient and work on getting treated.   Justice Britain, MD [facility] Office # [phone]

## 2022-03-13 NOTE — Telephone Encounter (Signed)
The pt has been advised and will call if there is any concern with the cost of the medication.

## 2022-03-13 NOTE — Telephone Encounter (Addendum)
Patient returned your call. Requesting a call back about 1:40 pm. Thankx

## 2022-03-13 NOTE — Telephone Encounter (Signed)
Left message on machine to call back  

## 2022-03-14 ENCOUNTER — Ambulatory Visit (INDEPENDENT_AMBULATORY_CARE_PROVIDER_SITE_OTHER): Payer: BC Managed Care – PPO | Admitting: Physician Assistant

## 2022-03-14 ENCOUNTER — Encounter: Payer: Self-pay | Admitting: Physician Assistant

## 2022-03-14 VITALS — BP 122/80 | HR 74 | Temp 97.7°F | Ht 67.0 in | Wt 172.0 lb

## 2022-03-14 DIAGNOSIS — Z23 Encounter for immunization: Secondary | ICD-10-CM

## 2022-03-14 DIAGNOSIS — F411 Generalized anxiety disorder: Secondary | ICD-10-CM | POA: Diagnosis not present

## 2022-03-14 MED ORDER — SERTRALINE HCL 25 MG PO TABS: 25.0000 mg | ORAL_TABLET | Freq: Every day | ORAL | 1 refills | Status: DC

## 2022-03-14 NOTE — Patient Instructions (Addendum)
It was great to see you!  Start 25 mg zoloft Tell someone you trust you are taking this Try to find a therapist  Start your omeprazole and xifaxin  PPIs are ok! There is much written and talked about possible health problems with the use of PPI medications (omeprazole (Prilosec), esomeprazole (Nexium), rabeprazole (Aciphex), lansoprazole (Prevacid), Dexlansoprazole (Dexilant) and pantoprazole ( Protonix).  The truth is the studies that suggest health problems with the use of PPI medications only find a weak association at best with the possible diseases reported to occur like dementia, bone fracture and kidney failure and even death. Many sicker patients with multiple health problems end up on these medications for various reasons. Establishing cause and effect in medical research is extremely difficult and though I believe there are very rare occurrences in which PPI's do cause harm in these ways, the benefits of you taking your PPI outweigh these largely theoretical and sometimes directly disproven risks.  As always, I/we will strive to have you on the lowest effective dose and frequency of a medication to keep you well and with a good quality of life.   Follow-up in 1 month (virtual is fine if needed)

## 2022-03-14 NOTE — Progress Notes (Signed)
Courtney Shelton is a 21 y.o. female here for a follow-up of a pre-existing problem.   History of Present Illness:   Chief Complaint  Patient presents with   Anxiety    Anxiety has gotten worse, would like to discuss an ESA    HPI  Anxiety She is feeling anxious during today's visit. She reports her anxiety has been getting worse. She was previously taking 50 mg Elavil, but she stopped taking it around June of 2023 because it was not refilled and she did not really feel like it was helping with her anxiety and migraines anyways.   She reports she occasionally gets mild panic attacks, due to her anxiety. Her school work might be causing her anxiety to worsen. She mentions of getting a dog on 03/17/2022 to help her anxiety. She is interested in seeing a therapist to manage her mental health.     Past Medical History:  Diagnosis Date   Anemia    Anxiety    Constipation    Depression    Dysmenorrhea in the adolescent    GERD (gastroesophageal reflux disease)    Lactose intolerance    Migraine    Syncope, near 2019   occasional, states she will feel it coming on and will sit down     Social History   Tobacco Use   Smoking status: Never   Smokeless tobacco: Never  Vaping Use   Vaping Use: Never used  Substance Use Topics   Alcohol use: Yes    Comment: 0-2 drinks per week , seltzer   Drug use: Never    Past Surgical History:  Procedure Laterality Date   TYMPANOSTOMY TUBE PLACEMENT Bilateral 2002   WISDOM TOOTH EXTRACTION  2016    Family History  Problem Relation Age of Onset   Thyroid disease Mother        acquired hypothyroidism, without surgery or irradiation   Anemia Mother    Irritable bowel syndrome Father    Hypertension Maternal Grandmother    Thyroid disease Maternal Grandmother        Acquired hypothyroidism late in life, without having had thyroid surgery or irradiation.    Other Maternal Grandmother        precancerous uterus   Hypertension Maternal  Grandfather    Bladder Cancer Maternal Grandfather    Breast cancer Paternal Grandmother    Colon cancer Neg Hx    Stomach cancer Neg Hx    Esophageal cancer Neg Hx    Pancreatic cancer Neg Hx    Colon polyps Neg Hx    Rectal cancer Neg Hx     Allergies  Allergen Reactions   Zithromax [Azithromycin Dihydrate]    Zyrtec [Cetirizine Hcl]     Pt gets a really bad headache    Current Medications:   Current Outpatient Medications:    B Complex Vitamins (B COMPLEX PO), Take 1 capsule by mouth daily in the afternoon., Disp: , Rfl:    etonogestrel-ethinyl estradiol (NUVARING) 0.12-0.015 MG/24HR vaginal ring, INSERT 1 RING VAGINALLY AS DIRECTED. REMOVE AFTER 3 WEEKS. WAIT 7 DAYS BEFORE INSERTING NEW RING, Disp: 3 each, Rfl: 1   sertraline (ZOLOFT) 25 MG tablet, Take 1 tablet (25 mg total) by mouth daily., Disp: 30 tablet, Rfl: 1   dicyclomine (BENTYL) 10 MG capsule, TAKE 1 CAPSULE (10 MG TOTAL) BY MOUTH 4 (FOUR) TIMES DAILY - BEFORE MEALS AND AT BEDTIME. (Patient not taking: Reported on 12/27/2021), Disp: 90 capsule, Rfl: 0   fluticasone (FLONASE) 50 MCG/ACT  nasal spray, Place 2 sprays into both nostrils daily. (Patient not taking: Reported on 03/14/2022), Disp: 16 g, Rfl: 6   omeprazole (PRILOSEC) 40 MG capsule, Take 1 capsule (40 mg total) by mouth daily. (Patient not taking: Reported on 12/27/2021), Disp: 90 capsule, Rfl: 3   ondansetron (ZOFRAN-ODT) 4 MG disintegrating tablet, Take 1 tablet (4 mg total) by mouth every 8 (eight) hours as needed for nausea or vomiting. (Patient not taking: Reported on 03/14/2022), Disp: 20 tablet, Rfl: 0   rifaximin (XIFAXAN) 550 MG TABS tablet, Take 1 tablet (550 mg total) by mouth 3 (three) times daily for 14 days. (Patient not taking: Reported on 03/14/2022), Disp: 20 tablet, Rfl: 0   vitamin C (ASCORBIC ACID) 500 MG tablet, Take 500 mg by mouth daily. (Patient not taking: Reported on 01/15/2022), Disp: , Rfl:    Zinc Sulfate (ZINC-220 PO), Take 1 tablet by mouth  daily in the afternoon. (Patient not taking: Reported on 01/15/2022), Disp: , Rfl:    Review of Systems:   Review of Systems  Psychiatric/Behavioral:  The patient is nervous/anxious.     Vitals:   Vitals:   03/14/22 0845  BP: 122/80  Pulse: 74  Temp: 97.7 F (36.5 C)  TempSrc: Temporal  SpO2: 99%  Weight: 172 lb (78 kg)  Height: '5\' 7"'$  (1.702 m)     Body mass index is 26.94 kg/m.  Physical Exam:   Physical Exam Constitutional:      General: She is not in acute distress.    Appearance: Normal appearance. She is not ill-appearing.  HENT:     Head: Normocephalic and atraumatic.     Right Ear: External ear normal.     Left Ear: External ear normal.  Eyes:     Extraocular Movements: Extraocular movements intact.     Pupils: Pupils are equal, round, and reactive to light.  Cardiovascular:     Rate and Rhythm: Normal rate and regular rhythm.     Heart sounds: Normal heart sounds. No murmur heard.    No gallop.  Pulmonary:     Effort: Pulmonary effort is normal. No respiratory distress.     Breath sounds: Normal breath sounds. No wheezing or rales.  Skin:    General: Skin is warm and dry.  Neurological:     Mental Status: She is alert and oriented to person, place, and time.  Psychiatric:        Mood and Affect: Affect is tearful.        Judgment: Judgment normal.     Assessment and Plan:   GAD (generalized anxiety disorder) Uncontrolled We will trial Zoloft 25 mg daily Risks, benefits, side effects discussed Recommend seeking counselor Follow-up in 1 month, sooner if concerns I discussed with patient that if they develop any SI, to tell someone immediately and seek medical attention. ESA letter provided  Need for immunization against influenza Completed today  I,Param Shah,acting as a scribe for [name], PA.,have documented all relevant documentation on the behalf of [name], PA,as directed by  [name], PA while in the presence of  [name], [place].  I, [name], [place], have reviewed all documentation for this visit. The documentation on 03/14/22 for the exam, diagnosis, procedures, and orders are all accurate and complete.  [name], PA-C

## 2022-04-06 ENCOUNTER — Other Ambulatory Visit: Payer: Self-pay | Admitting: Physician Assistant

## 2022-04-16 ENCOUNTER — Encounter: Payer: Self-pay | Admitting: Physician Assistant

## 2022-04-16 ENCOUNTER — Telehealth (INDEPENDENT_AMBULATORY_CARE_PROVIDER_SITE_OTHER): Payer: BC Managed Care – PPO | Admitting: Physician Assistant

## 2022-04-16 VITALS — Ht 67.0 in | Wt 165.0 lb

## 2022-04-16 DIAGNOSIS — Z124 Encounter for screening for malignant neoplasm of cervix: Secondary | ICD-10-CM | POA: Diagnosis not present

## 2022-04-16 DIAGNOSIS — F411 Generalized anxiety disorder: Secondary | ICD-10-CM

## 2022-04-16 MED ORDER — SERTRALINE HCL 50 MG PO TABS: 50.0000 mg | ORAL_TABLET | Freq: Every day | ORAL | 1 refills | Status: DC

## 2022-04-16 NOTE — Progress Notes (Signed)
I acted as a Education administrator for [employer], PA-C Anselmo Pickler, LPN  Virtual Visit via Video Note   I, [name], PA, connected with  Courtney Shelton  ([id number], 29-Dec-2000) on 04/16/22 at  8:40 AM EST by a video-enabled telemedicine application and verified that I am speaking with the correct person using two identifiers.  Location: Patient: Home Provider: [place] office   I discussed the limitations of evaluation and management by telemedicine and the availability of in person appointments. The patient expressed understanding and agreed to proceed.    History of Present Illness: Courtney Shelton is a 20 y.o. who identifies as a female who was assigned female at birth, and is being seen today for anxiety.   Pt currently taking Zoloft 25 mg daily. Pt says does not feel it has helped as much as she would like. Has helped with feeling less jittery. Denies panic attacks, SI/HI. She is getting set up with a talk therapist this week. She is interested in increasing this medication.  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 REMOVE AFTER 3 WEEKS. WAIT 7 DAYS BEFORE INSERTING NEW RING, Disp: 3 each, Rfl: 1   fluticasone (FLONASE) 50 MCG/ACT nasal spray, Place 2 sprays into both nostrils daily., Disp: 16 g, Rfl: 6   omeprazole (PRILOSEC) 40 MG capsule, Take 1 capsule (40 mg total) by mouth daily., Disp: 90 capsule, Rfl: 3   vitamin C (ASCORBIC ACID) 500 MG tablet, Take 500 mg by mouth daily., Disp: , Rfl:    Zinc Sulfate (ZINC-220 PO), Take 1 tablet by mouth daily in the afternoon., Disp: , Rfl:    sertraline (ZOLOFT) 50 MG tablet, Take 1 tablet (50 mg total) by mouth daily., Disp: 90 tablet, Rfl: 1  Observations/Objective: Patient is well-developed, well-nourished in no acute distress.  Resting comfortably  at home.  Head is normocephalic, atraumatic.  No labored breathing.  Speech is clear and coherent with logical content.  Patient is alert and oriented at baseline.    Assessment and Plan: 1. GAD (generalized anxiety disorder) Slight improvement Increase zoloft to 50 mg daily Denies SI/HI Follow-up in 3 months, sooner if concerns Continue efforts at talk therapy  2. Pap smear for cervical cancer screening - Ambulatory referral to Gynecology    Follow Up Instructions: I discussed the assessment and treatment plan with the patient. The patient was provided an opportunity to ask questions and all were answered. The patient agreed with the plan and demonstrated an understanding  of the instructions.  A copy of instructions were sent to the patient via MyChart unless otherwise noted below.   The patient was advised to call back or seek an in-person evaluation if the symptoms worsen or if the condition fails to improve as anticipated.  [name], [place]

## 2022-05-23 ENCOUNTER — Encounter: Payer: Self-pay | Admitting: Nurse Practitioner

## 2022-05-23 ENCOUNTER — Other Ambulatory Visit (HOSPITAL_COMMUNITY)
Admission: RE | Admit: 2022-05-23 | Discharge: 2022-05-23 | Disposition: A | Discharge status: Home / Self Care | Payer: BC Managed Care – PPO | Source: Ambulatory Visit | Attending: Nurse Practitioner | Admitting: Nurse Practitioner

## 2022-05-23 ENCOUNTER — Ambulatory Visit (INDEPENDENT_AMBULATORY_CARE_PROVIDER_SITE_OTHER): Payer: BC Managed Care – PPO | Admitting: Nurse Practitioner

## 2022-05-23 VITALS — BP 128/76 | Ht 67.0 in | Wt 170.0 lb

## 2022-05-23 DIAGNOSIS — Z01419 Encounter for gynecological examination (general) (routine) without abnormal findings: Secondary | ICD-10-CM

## 2022-05-23 DIAGNOSIS — Z113 Encounter for screening for infections with a predominantly sexual mode of transmission: Secondary | ICD-10-CM | POA: Diagnosis not present

## 2022-05-23 DIAGNOSIS — Z3044 Encounter for surveillance of vaginal ring hormonal contraceptive device: Secondary | ICD-10-CM

## 2022-05-23 NOTE — Progress Notes (Signed)
   Courtney Shelton [birth year] [id number]   History:  21 y.o. G0 presents as new patient to establish care. No GYN complaints. Monthly cycles. Hormonal contraception for heavy, painful periods. Good management with Nuvaring. Did not tolerate COCs. Mother with history of endometriosis. GAD managed by PCP. Declines Gardasil series.   Gynecologic History Patient's last menstrual period was 05/02/2022 (exact date). Period Cycle (Days): 28 Period Duration (Days): 7 Period Pattern: Regular Menstrual Flow: Moderate, Light Menstrual Control: Tampon, Maxi pad Dysmenorrhea: (!) Mild (varies mild to severe) Dysmenorrhea Symptoms: Cramping Contraception/Family planning: NuvaRing vaginal inserts Sexually active: Yes  Health Maintenance Last Pap: Never Last mammogram: Not indicated Last colonoscopy: 01/15/2022. Results were: 3-year recall Last Dexa: Not indicated  Past medical history, past surgical history, family history and social history were all reviewed and documented in the EPIC chart. Biology degree. 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 Boyfriend of 2 years. Working as Public relations account executive.   ROS:  A ROS was performed and pertinent positives and negatives are included.  Exam:  Vitals:   05/23/22 1109  BP: 128/76  Weight: 170 lb (77.1 kg)  Height: '5\' 7"'$  (1.702 m)   Body mass index is 26.63 kg/m.  General appearance:  Normal Thyroid:  Symmetrical, normal in size, without palpable masses or nodularity. Respiratory  Auscultation:  Clear without wheezing or rhonchi Cardiovascular  Auscultation:  Regular rate, without rubs, murmurs or gallops  Edema/varicosities:  Not grossly evident Abdominal  Soft,nontender, without masses, guarding or rebound.  Liver/spleen:  No organomegaly noted  Hernia:  None appreciated  Skin  Inspection:  Grossly normal Breasts: Examined lying and sitting.   Right: Without masses, retractions, nipple discharge or axillary  adenopathy.   Left: Without masses, retractions, nipple discharge or axillary adenopathy. Genitourinary   Inguinal/mons:  Normal without inguinal adenopathy  External genitalia:  Normal appearing vulva with no masses, tenderness, or lesions  BUS/Urethra/Skene's glands:  Normal  Vagina:  Normal appearing with normal color and discharge, no lesions  Cervix:  Normal appearing without discharge or lesions  Uterus:  Normal in size, shape and contour.  Midline and mobile, nontender  Adnexa/parametria:     Rt: Normal in size, without masses or tenderness.   Lt: Normal in size, without masses or tenderness.  Anus and perineum: Normal  Digital rectal exam: Not indicated  Patient informed chaperone available to be present for breast and pelvic exam. Patient has requested no chaperone to be present. Patient has been advised what will be completed during breast and pelvic exam.   Assessment/Plan:  21 y.o. G0 to establish care.   Well female exam with routine gynecological exam - Plan: Cytology - PAP( [facility]). Education provided on SBEs, importance of preventative screenings, current guidelines, high calcium diet, regular exercise, and multivitamin daily. Labs with PCP.   Screening examination for STD (sexually transmitted disease) - Plan: Cytology - PAP( ), RPR, HIV Antibody (routine testing w rflx). GC/CT/trich added to pap.   Encounter for surveillance of vaginal ring hormonal contraceptive device - Nuvaring. Using as prescribed. PCP provides refills.   Screening for cervical cancer - Initial pap today.   Return in 1 year for annual.     Tamela Gammon DNP, 11:29 AM 05/23/2022

## 2022-05-24 LAB — RPR: RPR Ser Ql: NONREACTIVE

## 2022-05-24 LAB — HIV ANTIBODY (ROUTINE TESTING W REFLEX): HIV 1&2 Ab, 4th Generation: NONREACTIVE

## 2022-05-29 ENCOUNTER — Encounter: Payer: Self-pay | Admitting: Nurse Practitioner

## 2022-05-29 LAB — CYTOLOGY - PAP
Chlamydia: NEGATIVE
Comment: NEGATIVE
Comment: NEGATIVE
Comment: NORMAL
Diagnosis: NEGATIVE
Diagnosis: REACTIVE
Neisseria Gonorrhea: NEGATIVE
Trichomonas: NEGATIVE

## 2022-06-14 ENCOUNTER — Other Ambulatory Visit: Payer: Self-pay | Admitting: Physician Assistant

## 2022-11-29 ENCOUNTER — Other Ambulatory Visit: Payer: Self-pay | Admitting: Physician Assistant

## 2022-12-02 ENCOUNTER — Other Ambulatory Visit: Payer: Self-pay | Admitting: Physician Assistant

## 2022-12-24 NOTE — Progress Notes (Signed)
Kalyna Paolella is a 22 y.o. female here for a new problem.  History of Present Illness:   No chief complaint on file.   HPI  Strep Throat    Past Medical History:  Diagnosis Date   Anemia    Anxiety    Constipation    Depression    Dysmenorrhea in the adolescent    GERD (gastroesophageal reflux disease)    Lactose intolerance    Migraine    Syncope, near 2019   occasional, states she will feel it coming on and will sit down     Social History   Tobacco Use   Smoking status: Never    Passive exposure: Current   Smokeless tobacco: Never  Vaping Use   Vaping status: Never Used  Substance Use Topics   Alcohol use: Yes    Comment: 0-2 drinks per week , seltzer   Drug use: Never    Past Surgical History:  Procedure Laterality Date   COLONOSCOPY  01/15/2022   TYMPANOSTOMY TUBE PLACEMENT Bilateral 2002   WISDOM TOOTH EXTRACTION  2016    Family History  Problem Relation Age of Onset   Thyroid disease Mother        acquired hypothyroidism, without surgery or irradiation   Anemia Mother    Irritable bowel syndrome Father    Hypertension Maternal Grandmother    Thyroid disease Maternal Grandmother        Acquired hypothyroidism late in life, without having had thyroid surgery or irradiation.    Other Maternal Grandmother        precancerous uterus   Atrial fibrillation Maternal Grandmother    Diverticulitis Maternal Grandmother    Hypertension Maternal Grandfather    Bladder Cancer Maternal Grandfather    Breast cancer Paternal Grandmother    Colon cancer Neg Hx    Stomach cancer Neg Hx    Esophageal cancer Neg Hx    Pancreatic cancer Neg Hx    Colon polyps Neg Hx    Rectal cancer Neg Hx     Allergies  Allergen Reactions   Zithromax [Azithromycin Dihydrate]    Zyrtec [Cetirizine Hcl]     Pt gets a really bad headache    Current Medications:   Current Outpatient Medications:    B Complex Vitamins (B COMPLEX PO), Take 1 capsule by mouth daily in  the afternoon., Disp: , Rfl:    etonogestrel-ethinyl estradiol (NUVARING) 0.12-0.015 MG/24HR vaginal ring, INSERT 1 RING VAGINALLY AS DIRECTED. REMOVE AFTER 3 WEEKS. WAIT 7 DAYS BEFORE INSERTING NEW RING, Disp: 3 each, Rfl: 1   fluticasone (FLONASE) 50 MCG/ACT nasal spray, Place 2 sprays into both nostrils daily., Disp: 16 g, Rfl: 6   omeprazole (PRILOSEC) 40 MG capsule, Take 1 capsule (40 mg total) by mouth daily. (Patient not taking: Reported on 05/23/2022), Disp: 90 capsule, Rfl: 3   sertraline (ZOLOFT) 50 MG tablet, TAKE 1 TABLET BY MOUTH EVERY DAY, Disp: 90 tablet, Rfl: 0   vitamin C (ASCORBIC ACID) 500 MG tablet, Take 500 mg by mouth daily., Disp: , Rfl:    Zinc Sulfate (ZINC-220 PO), Take 1 tablet by mouth daily in the afternoon., Disp: , Rfl:    Review of Systems:   ROS  Vitals:   There were no vitals filed for this visit.   There is no height or weight on file to calculate BMI.  Physical Exam:   Physical Exam  Assessment and Plan:   ***   I,Alexander Ruley,acting as a scribe for [employer],  PA.,have documented all relevant documentation on the behalf of Jarold Motto, PA,as directed by  Jarold Motto, PA while in the presence of [place], [place].   ***   Jarold Motto, PA-C

## 2022-12-25 ENCOUNTER — Telehealth: Payer: Self-pay | Admitting: Physician Assistant

## 2022-12-25 ENCOUNTER — Ambulatory Visit (INDEPENDENT_AMBULATORY_CARE_PROVIDER_SITE_OTHER): Payer: BC Managed Care – PPO | Admitting: Physician Assistant

## 2022-12-25 ENCOUNTER — Encounter: Payer: Self-pay | Admitting: Physician Assistant

## 2022-12-25 VITALS — BP 110/80 | HR 93 | Temp 97.8°F | Ht 67.0 in | Wt 185.0 lb

## 2022-12-25 DIAGNOSIS — J029 Acute pharyngitis, unspecified: Secondary | ICD-10-CM

## 2022-12-25 DIAGNOSIS — Z3044 Encounter for surveillance of vaginal ring hormonal contraceptive device: Secondary | ICD-10-CM

## 2022-12-25 DIAGNOSIS — R5383 Other fatigue: Secondary | ICD-10-CM

## 2022-12-25 LAB — POCT RAPID STREP A (OFFICE): Rapid Strep A Screen: POSITIVE — AB

## 2022-12-25 MED ORDER — PREDNISONE 20 MG PO TABS: 20.0000 mg | ORAL_TABLET | Freq: Two times a day (BID) | ORAL | 0 refills | Status: DC

## 2022-12-25 MED ORDER — AMOXICILLIN 500 MG PO CAPS: 500.0000 mg | ORAL_CAPSULE | Freq: Two times a day (BID) | ORAL | 0 refills | Status: AC

## 2022-12-25 MED ORDER — ETONOGESTREL-ETHINYL ESTRADIOL 0.12-0.015 MG/24HR VA RING: VAGINAL_RING | VAGINAL | 1 refills | Status: DC

## 2022-12-25 NOTE — Telephone Encounter (Signed)
Patient had Office Visit with PCP 12/25/22 at 10:00 am.  Patient called after Hours-was Triaged by Access Nurse.  Final Disposition: See PCP Within 24 Hours   Patient Name First: Courtney Last: Shelton Gender: Female DOB: [birth year] Age: 22 Y 10 M 24 D Return Phone Number: [phone] (Primary) Address: City/ State/ Zip: [place] [address] Client [facility] Night - Human resources officer [facility] Provider Jarold Motto- [place] Contact Type Call Who Is Calling Patient / Member / Family / Caregiver Call Type Triage / Clinical Relationship To Patient Self Return Phone Number [phone] (Primary) Chief Complaint Swallowing Difficulty Reason for Call Symptomatic / Request for Health Information Initial Comment Caller states she is experiencing pain with swallowing, body pain and white spots on her tonsils with inflammation. Translation No Nurse Assessment Nurse: Ericka Pontiff, RN, [place] Date/Time Lamount Cohen Time): 12/24/2022 9:35:53 PM Confirm and document reason for call. If symptomatic, describe symptoms. ---Caller states she has a sore throat with white spots on her tonsils and body aches. Symptoms started last night Does the patient have any new or worsening symptoms? ---Yes Will a triage be completed? ---Yes Related visit to physician within the last 2 weeks? ---No Does the PT have any chronic conditions? (i.e. diabetes, asthma, this includes High risk factors for pregnancy, etc.) ---Yes List chronic conditions. ---birth control setraline Is the patient pregnant or possibly pregnant? (Ask all females between the ages of 31-55) ---No Is this a behavioral health or substance abuse call? ---No Guidelines Guideline Title Affirmed Question Affirmed Notes Nurse Date/Time Lamount Cohen Time) Sore Throat SEVERE (e.g., excruciating) throat pain Ericka Pontiff, RN, [place]anda Foster 12/24/2022 9:39:23 PM  Disp. Time  Lamount Cohen Time) Disposition Final User 12/24/2022 9:44:59 PM See PCP within 24 Hours Yes Ericka Pontiff, RN, [place] Final Disposition 12/24/2022 9:44:59 PM See PCP within 24 Hours Yes Ericka Pontiff, RN, [place] Caller Disagree/Comply Comply Caller Understands Yes PreDisposition Did not know what to do Care Advice Given Per Guideline SEE PCP WITHIN 24 HOURS: * IF OFFICE WILL BE OPEN: You need to be examined within the next 24 hours. Call your doctor (or NP/PA) when the office opens and make an appointment. SOFT DIET: * Cold drinks, popsicles, and milk shakes are especially good. Avoid citrus fruits. CALL BACK IF: * You become worse CARE ADVICE given per Sore Throat (Adult) guideline.   Referrals REFERRED TO PCP OFFICE

## 2022-12-26 ENCOUNTER — Encounter: Payer: Self-pay | Admitting: Physician Assistant

## 2023-02-26 ENCOUNTER — Encounter: Payer: Self-pay | Admitting: Physician Assistant

## 2023-02-26 ENCOUNTER — Telehealth (INDEPENDENT_AMBULATORY_CARE_PROVIDER_SITE_OTHER): Payer: BC Managed Care – PPO | Admitting: Physician Assistant

## 2023-02-26 DIAGNOSIS — F411 Generalized anxiety disorder: Secondary | ICD-10-CM | POA: Diagnosis not present

## 2023-02-26 DIAGNOSIS — F320 Major depressive disorder, single episode, mild: Secondary | ICD-10-CM | POA: Diagnosis not present

## 2023-02-26 MED ORDER — FLUOXETINE HCL 20 MG PO CAPS: 20.0000 mg | ORAL_CAPSULE | Freq: Every day | ORAL | 1 refills | Status: DC

## 2023-02-26 NOTE — Progress Notes (Signed)
I acted as a Neurosurgeon for [employer], PA-C Corky Mull, LPN  Virtual Visit via Video Note   I, Jarold Motto, PA, connected with  Kenidy Sandal  ([id number], Aug 14, 2000) on 02/26/23 at 10:20 AM EDT by a video-enabled telemedicine application and verified that I am speaking with the correct person using two identifiers.  Location: Patient: Home Provider: [place] [facility] office   I discussed the limitations of evaluation and management by telemedicine and the availability of in person appointments. The patient expressed understanding and agreed to proceed.    History of Present Illness: Courtney Shelton is a 22 y.o. who identifies as a female who was assigned female at birth, and is being seen today for anxiety. Pt is currently taking Zoloft 50 mg, she says it is making her super tired and has gained 15 pounds, does not feel it is helping.  Would like to discuss other medication. The only other medication she has tried in the past is elavil - this did not help her mood. Denies SI/HI. Does not see a counselor and can not find one that she likes.  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 REMOVE AFTER 3 WEEKS. WAIT 7 DAYS BEFORE INSERTING NEW RING, Disp: 3 each, Rfl: 1   FLUoxetine (PROZAC) 20 MG capsule, Take 1 capsule (20 mg total) by mouth daily., Disp: 30 capsule, Rfl: 1   fluticasone (FLONASE) 50 MCG/ACT nasal spray, Place 2 sprays into both nostrils daily., Disp: 16 g, Rfl: 6   Prenatal Vit-DSS-Fe Cbn-FA (PRENATAL AD PO), Take 1 tablet by mouth daily in the afternoon., Disp: , Rfl:   Observations/Objective: Patient is well-developed, well-nourished in no acute distress.  Resting comfortably  at home.  Head is normocephalic, atraumatic.  No labored breathing.  Speech is clear and coherent with logical content.  Patient is alert and oriented at baseline.    Assessment and Plan: GAD (generalized anxiety disorder); Depression, major, single episode, mild (HCC) Uncontrolled She would like to trial new medication Recommendations as follows: Decrease Zoloft to 25 mg daily x  2 weeks Stop Zoloft and start Prozac 20 mg daily Message me in about 1 month after starting Prozac to update on status - sooner if concerns I discussed with patient that if they develop any SI, to tell someone immediately and seek medical attention. Denies SI/HI   Follow Up Instructions: I discussed the assessment and treatment plan with the patient. The patient was provided an opportunity to ask questions and all were answered. The patient agreed with the plan and demonstrated an understanding of the instructions.  A copy of instructions were  sent to the patient via MyChart unless otherwise noted below.   The patient was advised to call back or seek an in-person evaluation if the symptoms worsen or if the condition fails to improve as anticipated.  Jarold Motto, [place]

## 2023-02-26 NOTE — Patient Instructions (Signed)
Recommendations as follows: Decrease Zoloft to 25 mg daily x  2 weeks Stop Zoloft and start Prozac 20 mg daily Message me in about 1 month after starting Prozac to update on status - sooner if concerns

## 2023-03-25 ENCOUNTER — Other Ambulatory Visit: Payer: Self-pay | Admitting: Physician Assistant

## 2023-03-25 ENCOUNTER — Encounter: Payer: Self-pay | Admitting: Physician Assistant

## 2023-06-09 DIAGNOSIS — J329 Chronic sinusitis, unspecified: Secondary | ICD-10-CM | POA: Diagnosis not present

## 2023-06-28 ENCOUNTER — Other Ambulatory Visit: Payer: Self-pay | Admitting: Physician Assistant

## 2023-06-29 ENCOUNTER — Other Ambulatory Visit: Payer: Self-pay | Admitting: Physician Assistant

## 2023-06-30 ENCOUNTER — Other Ambulatory Visit: Payer: Self-pay | Admitting: Physician Assistant

## 2023-06-30 NOTE — Telephone Encounter (Signed)
Please see message from pharmacy.

## 2023-07-22 ENCOUNTER — Encounter: Payer: Self-pay | Admitting: Physician Assistant

## 2023-07-23 ENCOUNTER — Telehealth (INDEPENDENT_AMBULATORY_CARE_PROVIDER_SITE_OTHER): Payer: BC Managed Care – PPO | Admitting: Physician Assistant

## 2023-07-23 ENCOUNTER — Encounter: Payer: Self-pay | Admitting: Physician Assistant

## 2023-07-23 VITALS — Ht 67.0 in | Wt 190.0 lb

## 2023-07-23 DIAGNOSIS — G43009 Migraine without aura, not intractable, without status migrainosus: Secondary | ICD-10-CM | POA: Diagnosis not present

## 2023-07-23 MED ORDER — NURTEC 75 MG PO TBDP: 75.0000 mg | ORAL_TABLET | ORAL | 11 refills | Status: AC

## 2023-07-23 NOTE — Progress Notes (Signed)
Virtual Visit via Video Note   I, Jarold Motto, connected with  Courtney Shelton  ([id number], Aug 05, 2000) on 07/23/23 at  9:40 AM EST by a video-enabled telemedicine application and verified that I am speaking with the correct person using two identifiers.  Location: Patient: Home Provider: [facility] office   I discussed the limitations of evaluation and management by telemedicine and the availability of in person appointments. The patient expressed understanding and agreed to proceed.    History of Present Illness: Courtney Shelton is a 23 y.o. who identifies as a female who was assigned female at birth, and is being seen today for migraine.   Pt c/o increase in migraines, lasting longer for the past month. Migraine located about right eye and temporal area. Has not been taking anything for pain. She was taking amitriptyline regularly and this was helping but she is no longer on this because we stopped it when starting an SSRI. She is no longer on the ssri either. Saw pediatric neurology in 2019.  Denies new headache features. UpToDate on eye exam.   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 REMOVE AFTER 3 WEEKS. WAIT 7 DAYS BEFORE INSERTING NEW RING, Disp: 3 each, Rfl: 1   FLUoxetine (PROZAC) 20 MG capsule, TAKE 1 CAPSULE BY MOUTH EVERY DAY, Disp: 90 capsule, Rfl: 0   fluticasone (FLONASE) 50 MCG/ACT nasal spray, Place 2 sprays into both nostrils daily., Disp: 16 g, Rfl: 6   Prenatal Vit-DSS-Fe Cbn-FA (PRENATAL AD PO), Take 1 tablet by mouth daily in the afternoon., Disp: , Rfl:    Rimegepant Sulfate (NURTEC) 75 MG TBDP, Take 1 tablet (75 mg total) by mouth every other day., Disp: 16 tablet, Rfl: 11   tirzepatide (MOUNJARO) 10 MG/0.5ML Pen, Inject 10 mg into the skin once a week., Disp: , Rfl:   Observations/Objective: Patient is well-developed, well-nourished in no acute distress.  Resting comfortably  at home.  Head is normocephalic, atraumatic.  No labored breathing.  Speech is clear and coherent with logical content.  Patient is alert and oriented at baseline.   Assessment and Plan: 1. Atypical migraine (Primary) Uncontrolled Denies new red-flag symptom(s) Will trial Nurtec 75 mg every other day for preventative treatment I have asked her to message me after taking a few doses so I know if this medication is helping her and so we can navigate next steps We may need to return to elavil   Follow Up Instructions: I discussed the assessment and treatment plan with the patient. The patient was provided an opportunity to ask questions  and all were answered. The patient agreed with the plan and demonstrated an understanding of the instructions.  A copy of instructions were sent to the patient via MyChart unless otherwise noted below.   The patient was advised to call back or seek an in-person evaluation if the symptoms worsen or if the condition fails to improve as anticipated.  Jarold Motto, [place]

## 2023-07-25 ENCOUNTER — Other Ambulatory Visit (HOSPITAL_COMMUNITY): Payer: Self-pay

## 2023-07-28 ENCOUNTER — Other Ambulatory Visit (HOSPITAL_COMMUNITY): Payer: Self-pay

## 2023-07-30 ENCOUNTER — Other Ambulatory Visit (HOSPITAL_COMMUNITY): Payer: Self-pay

## 2023-07-30 ENCOUNTER — Encounter: Payer: Self-pay | Admitting: Pharmacy Technician

## 2023-07-30 ENCOUNTER — Telehealth: Payer: Self-pay | Admitting: Pharmacy Technician

## 2023-07-30 NOTE — Telephone Encounter (Signed)
ERROR

## 2023-07-30 NOTE — Telephone Encounter (Signed)
 Pharmacy Patient Advocate Encounter   Received notification from CoverMyMeds that prior authorization for NURTEC ODT 75MG  TABLETS is required/requested.   Insurance verification completed.   The patient is insured through [employer]  .   Per test claim: PA required; PA submitted to above mentioned insurance via CoverMyMeds Key/confirmation #/EOC [id number] Status is pending

## 2023-08-05 NOTE — Telephone Encounter (Signed)
 Pharmacy Patient Advocate Encounter  Received notification from [employer] that Prior Authorization for  NURTEC ODT 75MG  TABLETS  has been DENIED.  Full denial letter will be uploaded to the media tab. See denial reason below.   PA #/Case ID/Reference #: [id number]

## 2023-08-12 ENCOUNTER — Telehealth: Payer: Self-pay

## 2023-08-12 ENCOUNTER — Other Ambulatory Visit (HOSPITAL_COMMUNITY): Payer: Self-pay

## 2023-08-12 NOTE — Telephone Encounter (Signed)
 Pharmacy Patient Advocate Encounter   Received notification from Pt Calls Messages that prior authorization for Nurtec 75MG  dispersible tablets is required/requested.   Insurance verification completed.   The patient is insured through [facility] [place]  .   Per test claim:  PA required; PA started via Phone. Waiting for PA form from insurance.    Tried to resubmit via CMM, eligibility check did not pull up patient. Called insurance at [phone] to initiate new PA. Representative is faxing new PA form. Will complete and fax back once received.

## 2023-08-12 NOTE — Telephone Encounter (Signed)
 Please resubmit PA for Nurtec. Samantha sent you a message also.

## 2023-08-12 NOTE — Telephone Encounter (Signed)
 PA form and chart notes faxed to [phone]

## 2023-08-12 NOTE — Telephone Encounter (Signed)
 PA request has been Started. New Encounter has been or will be created for follow up. For additional info see Pharmacy Prior Auth telephone encounter from 08/12/23.   Tried to resubmit via CMM, eligibility check did not pull up patient. Called insurance at [phone] to initiate new PA. Representative is faxing new PA form. Will complete and fax back once received.

## 2023-08-12 NOTE — Telephone Encounter (Signed)
 Please see message about Nurtec and advise.

## 2023-08-15 ENCOUNTER — Other Ambulatory Visit (HOSPITAL_COMMUNITY): Payer: Self-pay

## 2023-08-15 NOTE — Telephone Encounter (Signed)
 Pharmacy Patient Advocate Encounter  Received notification from [facility] [employer] that Prior Authorization for NURTEC has been APPROVED from 07/15/23 to 08/13/24. Ran test claim, Copay is $19.97. This test claim was processed through [facility]- copay amounts may vary at other pharmacies due to pharmacy/plan contracts, or as the patient moves through the different stages of their insurance plan.   PA #/Case ID/Reference #: [id number]

## 2023-08-18 NOTE — Telephone Encounter (Signed)
 Spoke to pt told her Rx for Nurtec has been approved for one year by insurance. Pt verbalized understanding.

## 2023-09-28 ENCOUNTER — Other Ambulatory Visit: Payer: Self-pay | Admitting: Physician Assistant

## 2023-10-06 ENCOUNTER — Ambulatory Visit: Admitting: Physician Assistant

## 2023-10-27 ENCOUNTER — Ambulatory Visit (INDEPENDENT_AMBULATORY_CARE_PROVIDER_SITE_OTHER): Admitting: Physician Assistant

## 2023-10-27 VITALS — BP 124/80 | HR 68 | Temp 98.6°F | Ht 67.0 in | Wt 172.2 lb

## 2023-10-27 DIAGNOSIS — Z111 Encounter for screening for respiratory tuberculosis: Secondary | ICD-10-CM | POA: Diagnosis not present

## 2023-10-27 DIAGNOSIS — Z1159 Encounter for screening for other viral diseases: Secondary | ICD-10-CM | POA: Diagnosis not present

## 2023-10-27 DIAGNOSIS — Z0000 Encounter for general adult medical examination without abnormal findings: Secondary | ICD-10-CM

## 2023-10-27 DIAGNOSIS — R42 Dizziness and giddiness: Secondary | ICD-10-CM

## 2023-10-27 DIAGNOSIS — Z0184 Encounter for antibody response examination: Secondary | ICD-10-CM | POA: Diagnosis not present

## 2023-10-27 LAB — COMPREHENSIVE METABOLIC PANEL WITH GFR
ALT: 18 U/L (ref 0–35)
AST: 20 U/L (ref 0–37)
Albumin: 4.3 g/dL (ref 3.5–5.2)
Alkaline Phosphatase: 43 U/L (ref 39–117)
BUN: 8 mg/dL (ref 6–23)
CO2: 26 meq/L (ref 19–32)
Calcium: 9.5 mg/dL (ref 8.4–10.5)
Chloride: 105 meq/L (ref 96–112)
Creatinine, Ser: 0.59 mg/dL (ref 0.40–1.20)
GFR: 126.75 mL/min (ref >60.00)
Glucose, Bld: 80 mg/dL (ref 70–99)
Potassium: 3.7 meq/L (ref 3.5–5.1)
Sodium: 141 meq/L (ref 135–145)
Total Bilirubin: 0.3 mg/dL (ref 0.2–1.2)
Total Protein: 7.7 g/dL (ref 6.0–8.3)

## 2023-10-27 LAB — CBC WITH DIFFERENTIAL/PLATELET
Basophils Absolute: 0 10*3/uL (ref 0.0–0.1)
Basophils Relative: 0.7 % (ref 0.0–3.0)
Eosinophils Absolute: 0.2 10*3/uL (ref 0.0–0.7)
Eosinophils Relative: 3.6 % (ref 0.0–5.0)
HCT: 38.8 % (ref 36.0–46.0)
Hemoglobin: 13.4 g/dL (ref 12.0–15.0)
Lymphocytes Relative: 28.7 % (ref 12.0–46.0)
Lymphs Abs: 1.8 10*3/uL (ref 0.7–4.0)
MCHC: 34.5 g/dL (ref 30.0–36.0)
MCV: 85.9 fl (ref 78.0–100.0)
Monocytes Absolute: 0.4 10*3/uL (ref 0.1–1.0)
Monocytes Relative: 5.9 % (ref 3.0–12.0)
Neutro Abs: 3.7 10*3/uL (ref 1.4–7.7)
Neutrophils Relative %: 61.1 % (ref 43.0–77.0)
Platelets: 300 10*3/uL (ref 150.0–400.0)
RBC: 4.52 Mil/uL (ref 3.87–5.11)
RDW: 13.7 % (ref 11.5–15.5)
WBC: 6.1 10*3/uL (ref 4.0–10.5)

## 2023-10-27 LAB — LIPID PANEL
Cholesterol: 193 mg/dL (ref 0–200)
HDL: 50.2 mg/dL (ref >39.00)
LDL Cholesterol: 109 mg/dL — ABNORMAL HIGH (ref 0–99)
NonHDL: 142.34
Total CHOL/HDL Ratio: 4
Triglycerides: 168 mg/dL — ABNORMAL HIGH (ref 0.0–149.0)
VLDL: 33.6 mg/dL (ref 0.0–40.0)

## 2023-10-27 NOTE — Progress Notes (Signed)
 Subjective:    Courtney Shelton is a 23 y.o. female and is here for a comprehensive physical exam.  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  Chronic Issues: Weight management Pt is on Mounjaro 10 mg once weekly. Good compliance and tolerance. She states it helps reduce her binge eating. She endorses healthy. eating and walking her dog to facilitate weight loss.  Migraines Pt has not been able to obtain her Nurtec prescription, but is working on it. No acute concerns reported today.  Positional Lightheadedness Pt reports occasional dizziness. She states she will sit down when she can when this occurs. She notes some concern for POTS, but will address this problem in the future.  GAD / Depression Pt is on Prozac  20 mg once daily. Good compliance and tolerance. No acute concerns reported today.  Birth control Pt is on Nuvaring. No acute concerns reported today.  Health Maintenance: Immunizations -- See above. Colonoscopy -- N/a Mammogram -- N/a PAP -- Last done 05/23/2022, result were NILM. Bone Density -- N/a Diet -- Overall healthy diet. Exercise -- Walks dog.  Sleep habits -- Good sleeping habits reported. Mood -- Stable.  UTD with dentist? - UTD UTD with eye doctor? - UTD  Weight history: Wt Readings from Last 10 Encounters:  10/27/23 172 lb 4 oz (78.1 kg)  07/23/23 190 lb (86.2 kg)  12/25/22 185 lb (83.9 kg)  05/23/22 170 lb (77.1 kg)  04/16/22 165 lb (74.8 kg)  03/14/22 172 lb (78 kg)  01/15/22 172 lb (78 kg)  12/27/21 172 lb (78 kg)  05/02/21 174 lb 6.4 oz (79.1 kg)  05/02/21 173 lb (78.5 kg)   Body mass index is 26.98 kg/m. Patient's last menstrual period was 10/07/2023 (exact date).  Alcohol use:  reports current alcohol use.  Tobacco use:   Tobacco Use: Medium Risk (10/27/2023)   Patient History    Smoking Tobacco Use: Never    Smokeless Tobacco Use: Never    Passive Exposure: Current   Eligible for lung cancer screening? no     07/23/2023    9:31 AM  Depression screen PHQ 2/9  Decreased Interest 1  Down, Depressed, Hopeless 1  PHQ - 2 Score 2  Altered sleeping 1  Tired, decreased energy 3  Change in appetite 0  Feeling bad or failure about yourself  0  Trouble concentrating 3  Moving slowly or fidgety/restless 0  Suicidal thoughts 0  PHQ-9 Score 9  Difficult doing work/chores Somewhat difficult     Other providers/specialists: Patient Care Team: Alexander Iba, [place] as PCP - General (Physician Assistant)    PMHx, SurgHx, SocialHx, Medications, and Allergies were reviewed in the Visit Navigator and updated as appropriate.   Past Medical History:  Diagnosis Date   Allergy    Zyrtec, zythromax   Anemia    Anxiety    Constipation    Depression    Dysmenorrhea in the adolescent    GERD (gastroesophageal reflux disease)    Lactose intolerance    Migraine    Syncope, near 2019   occasional, states she will feel it coming on and will sit down     Past Surgical History:  Procedure Laterality Date   COLONOSCOPY  01/15/2022   TYMPANOSTOMY TUBE PLACEMENT Bilateral 2002  WISDOM TOOTH EXTRACTION  2016     Family History  Problem Relation Age of Onset   Thyroid  disease Mother        acquired hypothyroidism, without surgery or irradiation   Anemia Mother    Irritable bowel syndrome Father    Hypertension Maternal Grandmother    Thyroid  disease Maternal Grandmother        Acquired hypothyroidism late in life, without having had thyroid  surgery or irradiation.    Other Maternal Grandmother        precancerous uterus   Atrial fibrillation Maternal Grandmother    Diverticulitis Maternal Grandmother    Arthritis Maternal Grandmother    Hypertension Maternal Grandfather    Bladder Cancer Maternal  Grandfather    Cancer Maternal Grandfather    Breast cancer Paternal Grandmother    Colon cancer Neg Hx    Stomach cancer Neg Hx    Esophageal cancer Neg Hx    Pancreatic cancer Neg Hx    Colon polyps Neg Hx    Rectal cancer Neg Hx     Social History   Tobacco Use   Smoking status: Never    Passive exposure: Current   Smokeless tobacco: Never  Vaping Use   Vaping status: Never Used  Substance Use Topics   Alcohol use: Yes    Comment: 0-2 drinks per week , seltzer   Drug use: Never    Review of Systems:   Review of Systems  Constitutional:  Negative for chills, fever, malaise/fatigue and weight loss.  HENT:  Negative for hearing loss, sinus pain and sore throat.   Respiratory:  Negative for cough and hemoptysis.   Cardiovascular:  Negative for chest pain, palpitations, leg swelling and PND.  Gastrointestinal:  Negative for abdominal pain, constipation, diarrhea, heartburn, nausea and vomiting.  Genitourinary:  Negative for dysuria, frequency and urgency.  Musculoskeletal:  Negative for back pain, myalgias and neck pain.  Skin:  Negative for itching and rash.  Neurological:  Negative for dizziness, tingling, seizures and headaches.  Endo/Heme/Allergies:  Negative for polydipsia.  Psychiatric/Behavioral:  Negative for depression. The patient is not nervous/anxious.       Objective:   BP 124/80 (BP Location: Left Arm, Patient Position: Sitting, Cuff Size: Normal)   Pulse 68   Temp 98.6 F (37 C) (Temporal)   Ht 5\' 7"  (1.702 m)   Wt 172 lb 4 oz (78.1 kg)   LMP 10/07/2023 (Exact Date)   SpO2 98%   BMI 26.98 kg/m  Body mass index is 26.98 kg/m.   General Appearance:    Alert, cooperative, no distress, appears stated age  Head:    Normocephalic, without obvious abnormality, atraumatic  Eyes:    PERRL, conjunctiva/corneas clear, EOM's intact, fundi    benign, both eyes  Ears:    Normal TM's and external ear canals, both ears  Nose:   Nares normal, septum midline,  mucosa normal, no drainage    or sinus tenderness  Throat:   Lips, mucosa, and tongue normal; teeth and gums normal  Neck:   Supple, symmetrical, trachea midline, no adenopathy;    thyroid :  no enlargement/tenderness/nodules; no carotid   bruit or JVD  Back:     Symmetric, no curvature, ROM normal, no CVA tenderness  Lungs:     Clear to auscultation bilaterally, respirations unlabored  Chest Wall:    No tenderness or deformity   Heart:    Regular rate and rhythm, S1 and S2 normal, no murmur, rub or gallop  Breast Exam:    Deferred  Abdomen:     Soft, non-tender, bowel sounds active all four quadrants,    no masses, no organomegaly  Genitalia:    Deferred  Extremities:   Extremities normal, atraumatic, no cyanosis or edema  Pulses:   2+ and symmetric all extremities  Skin:   Skin color, texture, turgor normal, no rashes or lesions  Lymph nodes:   Cervical, supraclavicular, and axillary nodes normal  Neurologic:   CNII-XII intact, normal strength, sensation and reflexes    throughout    Assessment/Plan:   Routine physical examination Today patient counseled on age appropriate routine health concerns for screening and prevention, each reviewed and up to date or declined. Immunizations reviewed and up to date or declined. Labs ordered and reviewed. Risk factors for depression reviewed and negative. Hearing function and visual acuity are intact. ADLs screened and addressed as needed. Functional ability and level of safety reviewed and appropriate. Education, counseling and referrals performed based on assessed risks today. Patient provided with a copy of personalized plan for preventive services.  Immunity status testing Complete today for school  Screening for tuberculosis Completed today for school  Encounter for screening for other viral diseases Update today  Positional lightheadedness Overall stable We may need to refer back to cardiology to consider Postural Orthostatic  Tachycardia Syndrome (POTS) evaluation   I, Timoteo Force, acting as a Neurosurgeon for [employer], [place]., have documented all relevant documentation on the behalf of Alexander Iba, [place], as directed by  Alexander Iba, PA while in the presence of Alexander Iba, [place].  I, Darl Edu, have reviewed all documentation for this visit. The documentation on 10/27/23 for the exam, diagnosis, procedures, and orders are all accurate and complete.  Alexander Iba, PA-C [facility]

## 2023-10-29 ENCOUNTER — Ambulatory Visit: Payer: Self-pay | Admitting: Physician Assistant

## 2023-10-29 LAB — HEPATITIS B SURFACE ANTIBODY, QUANTITATIVE: Hep B S AB Quant (Post): <5 m[IU]/mL — ABNORMAL LOW (ref >=10)

## 2023-10-29 LAB — QUANTIFERON-TB GOLD PLUS
Mitogen-NIL: >10 [IU]/mL
NIL: 0.02 [IU]/mL
QuantiFERON-TB Gold Plus: NEGATIVE
TB1-NIL: 0 [IU]/mL
TB2-NIL: 0 [IU]/mL

## 2023-10-29 LAB — HEPATITIS C ANTIBODY: Hepatitis C Ab: NONREACTIVE

## 2023-11-04 ENCOUNTER — Ambulatory Visit (INDEPENDENT_AMBULATORY_CARE_PROVIDER_SITE_OTHER): Admitting: *Deleted

## 2023-11-04 DIAGNOSIS — Z23 Encounter for immunization: Secondary | ICD-10-CM | POA: Diagnosis not present

## 2023-11-04 NOTE — Progress Notes (Signed)
 Patient present for Hep B immunization  Given on Left deltoid Patient tolerated well

## 2023-11-19 ENCOUNTER — Other Ambulatory Visit: Payer: Self-pay | Admitting: *Deleted

## 2023-11-19 MED ORDER — FLUOXETINE HCL 20 MG PO CAPS: 20.0000 mg | ORAL_CAPSULE | Freq: Every day | ORAL | 1 refills | Status: DC

## 2023-12-21 ENCOUNTER — Other Ambulatory Visit: Payer: Self-pay | Admitting: Physician Assistant

## 2024-03-10 ENCOUNTER — Other Ambulatory Visit

## 2024-03-10 ENCOUNTER — Ambulatory Visit: Admitting: *Deleted

## 2024-03-10 DIAGNOSIS — Z0184 Encounter for antibody response examination: Secondary | ICD-10-CM | POA: Diagnosis not present

## 2024-03-11 ENCOUNTER — Ambulatory Visit: Payer: Self-pay | Admitting: Physician Assistant

## 2024-03-11 LAB — HEPATITIS B SURFACE ANTIBODY, QUANTITATIVE: Hep B S AB Quant (Post): >1000 m[IU]/mL (ref >=10)

## 2024-03-12 NOTE — Progress Notes (Signed)
 Error

## 2024-06-08 ENCOUNTER — Other Ambulatory Visit: Payer: Self-pay | Admitting: Physician Assistant

## 2024-07-06 ENCOUNTER — Other Ambulatory Visit: Payer: Self-pay | Admitting: Physician Assistant
